# Patient Record
Sex: Male | Born: 1960 | Race: White | Hispanic: No | Smoking: Current some day smoker
Health system: Southern US, Community
[De-identification: ages and names within clinical notes are randomized; demographics above are authoritative.]

## PROBLEM LIST (undated history)

## (undated) DIAGNOSIS — T7840XA Allergy, unspecified, initial encounter: Secondary | ICD-10-CM

## (undated) DIAGNOSIS — M1990 Unspecified osteoarthritis, unspecified site: Secondary | ICD-10-CM

## (undated) DIAGNOSIS — J301 Allergic rhinitis due to pollen: Secondary | ICD-10-CM

## (undated) DIAGNOSIS — I10 Essential (primary) hypertension: Secondary | ICD-10-CM

## (undated) HISTORY — DX: Unspecified osteoarthritis, unspecified site: M19.90

## (undated) HISTORY — DX: Allergy, unspecified, initial encounter: T78.40XA

## (undated) HISTORY — DX: Allergic rhinitis due to pollen: J30.1

## (undated) HISTORY — PX: TONSILLECTOMY AND ADENOIDECTOMY: SUR1326

---

## 2003-09-14 HISTORY — PX: OTHER SURGICAL HISTORY: SHX169

## 2005-04-27 ENCOUNTER — Ambulatory Visit: Payer: Self-pay | Admitting: Otolaryngology

## 2008-07-30 ENCOUNTER — Ambulatory Visit: Payer: Self-pay | Admitting: Family Medicine

## 2008-07-30 DIAGNOSIS — B356 Tinea cruris: Secondary | ICD-10-CM

## 2008-07-30 DIAGNOSIS — J309 Allergic rhinitis, unspecified: Secondary | ICD-10-CM | POA: Insufficient documentation

## 2008-07-30 DIAGNOSIS — R599 Enlarged lymph nodes, unspecified: Secondary | ICD-10-CM | POA: Insufficient documentation

## 2008-07-31 LAB — CONVERTED CEMR LAB
Basophils Absolute: 0 10*3/uL (ref 0.0–0.1)
Eosinophils Absolute: 0.1 10*3/uL (ref 0.0–0.7)
Eosinophils Relative: 1.8 % (ref 0.0–5.0)
MCHC: 34.3 g/dL (ref 30.0–36.0)
MCV: 96.2 fL (ref 78.0–100.0)
Neutrophils Relative %: 60.1 % (ref 43.0–77.0)
PSA: 0.45 ng/mL (ref 0.10–4.00)
Platelets: 177 10*3/uL (ref 150–400)
RDW: 12.9 % (ref 11.5–14.6)
Sed Rate: 6 mm/hr (ref 0–16)
WBC: 4 10*3/uL — ABNORMAL LOW (ref 4.5–10.5)

## 2008-09-24 ENCOUNTER — Ambulatory Visit: Payer: Self-pay | Admitting: Family Medicine

## 2008-09-24 DIAGNOSIS — R079 Chest pain, unspecified: Secondary | ICD-10-CM

## 2008-09-24 DIAGNOSIS — M25519 Pain in unspecified shoulder: Secondary | ICD-10-CM | POA: Insufficient documentation

## 2008-10-08 LAB — CONVERTED CEMR LAB
AST: 27 units/L (ref 0–37)
Albumin: 4.5 g/dL (ref 3.5–5.2)
Alkaline Phosphatase: 33 units/L — ABNORMAL LOW (ref 39–117)
BUN: 16 mg/dL (ref 6–23)
Bilirubin, Direct: 0.1 mg/dL (ref 0.0–0.3)
CO2: 31 meq/L (ref 19–32)
Chloride: 105 meq/L (ref 96–112)
Cholesterol: 177 mg/dL (ref 0–200)
Glucose, Bld: 107 mg/dL — ABNORMAL HIGH (ref 70–99)
Potassium: 4.6 meq/L (ref 3.5–5.1)
Sodium: 141 meq/L (ref 135–145)
Total Protein: 6.8 g/dL (ref 6.0–8.3)

## 2010-01-08 ENCOUNTER — Telehealth (INDEPENDENT_AMBULATORY_CARE_PROVIDER_SITE_OTHER): Payer: Self-pay | Admitting: *Deleted

## 2010-10-13 NOTE — Progress Notes (Signed)
Summary: clarinex   Phone Note Refill Request Call back at Home Phone [phone] Message from:  Patient on January 08, 2010 10:24 AM  Refills Requested: Medication #1:  CLARINEX-D 12 HOUR 2.5-120 MG XR12H-TAB take one tablet as needed Patient is having problems with his allergies and would like a refill sent to [facility] Please advise.   Initial call taken by: Melody Comas,  January 08, 2010 10:25 AM    Prescriptions: CLARINEX-D 12 HOUR 2.5-120 MG XR12H-TAB [facility]) take one tablet as needed  #180 x 0   Entered by:   Benny Lennert CMA (AAMA)   Authorized by:   Kerby Nora MD   Signed by:   Benny Lennert CMA (AAMA) on 01/08/2010   Method used:   Electronically to        [id number] (retail)       [address]       [place], [place]  [address]       Ph: [phone]       Fax: [phone]   RxID:   [id number]

## 2011-01-19 ENCOUNTER — Other Ambulatory Visit: Payer: Self-pay | Admitting: Family Medicine

## 2011-01-25 ENCOUNTER — Telehealth: Payer: Self-pay | Admitting: *Deleted

## 2011-01-25 NOTE — Telephone Encounter (Signed)
Fax from pharmacy states clarinex D 12 hour is on back order, they are asking to change to something else.

## 2011-01-25 NOTE — Telephone Encounter (Signed)
Rec OTC Claritin-D or Zyrtec-D. These are almost the same.

## 2011-01-26 NOTE — Telephone Encounter (Signed)
Fax faxed back to pharmacy with info.

## 2011-02-16 ENCOUNTER — Encounter: Payer: Self-pay | Admitting: Family Medicine

## 2011-02-17 ENCOUNTER — Encounter: Payer: Self-pay | Admitting: Family Medicine

## 2011-02-17 ENCOUNTER — Ambulatory Visit (INDEPENDENT_AMBULATORY_CARE_PROVIDER_SITE_OTHER): Payer: BC Managed Care – PPO | Admitting: Family Medicine

## 2011-02-17 DIAGNOSIS — F32A Depression, unspecified: Secondary | ICD-10-CM | POA: Insufficient documentation

## 2011-02-17 DIAGNOSIS — R079 Chest pain, unspecified: Secondary | ICD-10-CM

## 2011-02-17 DIAGNOSIS — F3289 Other specified depressive episodes: Secondary | ICD-10-CM

## 2011-02-17 DIAGNOSIS — F329 Major depressive disorder, single episode, unspecified: Secondary | ICD-10-CM

## 2011-02-17 HISTORY — DX: Depression, unspecified: F32.A

## 2011-02-17 MED ORDER — CITALOPRAM HYDROBROMIDE 20 MG PO TABS: 20.0000 mg | ORAL_TABLET | Freq: Every day | ORAL | Status: DC

## 2011-02-17 MED ORDER — DESLORATADINE-PSEUDOEPHED ER 2.5-120 MG PO TB12: 1.0000 | ORAL_TABLET | Freq: Every day | ORAL | Status: DC

## 2011-02-17 MED ORDER — SILDENAFIL CITRATE 100 MG PO TABS: 100.0000 mg | ORAL_TABLET | ORAL | Status: AC | PRN

## 2011-02-17 NOTE — Progress Notes (Signed)
Chad Petersen, a 49 y.o. male presents today in the office for the following:    Chest pain: No smoker No FH CAD Tob? No No HTN Handful of times. Will wonder why it will happen. Will take a baby aspirin. Will last maybe a couple of hours, sometimes much less than this. Goes on the stair stepper for 3-5 minutes.  Had some mild chest pain. Not associated with exertion.  Depression:  Wife had lumpectomy, radiation, chemo actively. He is taking this very poorly. He is upset, and his wife has become very frail. Their relationship is significantly changed compared to when they were younger. 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   Depression Viagra trial  Patient Active Problem List  Diagnoses  . ALLERGIC RHINITIS  . Depression   Past Medical History  Diagnosis Date  . Arthritis   . Allergic rhinitis due to pollen    Past Surgical History  Procedure Date  . Facial lump 2005    removed   History  Substance Use Topics  . Smoking status: Never Smoker   . Smokeless tobacco: Not on file  . Alcohol Use: Yes   Family History  Problem Relation Age of Onset  . Diabetes Other   . Cancer Other     prostate   No Known Allergies Current Outpatient Prescriptions on File Prior to Visit  Medication Sig Dispense Refill  . DISCONTD: CLARINEX-D 12 HOUR 2.5-120 MG per tablet TAKE ONE TABLET AS NEEDED  180 tablet  3  . DISCONTD: LORazepam (ATIVAN) 0.5 MG tablet Take 0.5 mg by mouth 3 (three) times daily as needed.         ROS: GEN: No acute illnesses, no fevers, chills. GI: No n/v/d, eating normally Pulm: No SOB Interactive and getting along well at home.  Otherwise, ROS is as per the HPI.   Physical Exam  Blood pressure 110/80, pulse 64, temperature 97.9 F (36.6 C), temperature source Oral, height 5\' 10"  (1.778 m), weight 161 lb 12 oz (73.369 kg).  GEN: WDWN, NAD, Non-toxic, A & O x 3 HEENT: Atraumatic, Normocephalic. Neck supple. No  masses, No LAD. Ears and Nose: No external deformity. CV: RRR, No M/G/R. No JVD. No thrill. No extra heart sounds. PULM: CTA B, no wheezes, crackles, rhonchi. No retractions. No resp. distress. No accessory muscle use. EXTR: No c/c/e NEURO Normal gait.  PSYCH: Normally interactive. Conversant. Not depressed or anxious appearing.  Calm demeanor.   A/P:  Impression: I think the patient meets clinical criteria for major depression. Some of this may be situational. Him and start him on an SSRI and followup in month.  2. Chest pain: At this point, think is history sounds very atypical, and most consistent with depression and anxiety associated chest pain. He has zero risk factors and a very atypical story. I think it is reasonable to start his antidepressant and then followup in a month.  3. Erectile difficulties, trial of Viagra.

## 2011-02-17 NOTE — Patient Instructions (Signed)
AVOID SUDAFED OR SOMETHING WITH -D  ALLEGRA ZYRTEC CLARITIN

## 2011-03-29 ENCOUNTER — Ambulatory Visit: Payer: BC Managed Care – PPO | Admitting: Family Medicine

## 2011-10-08 ENCOUNTER — Ambulatory Visit (INDEPENDENT_AMBULATORY_CARE_PROVIDER_SITE_OTHER): Payer: BC Managed Care – PPO | Admitting: Family Medicine

## 2011-10-08 ENCOUNTER — Encounter: Payer: Self-pay | Admitting: Family Medicine

## 2011-10-08 VITALS — BP 130/80 | HR 88 | Temp 98.1°F | Ht 70.0 in | Wt 157.4 lb

## 2011-10-08 DIAGNOSIS — Z202 Contact with and (suspected) exposure to infections with a predominantly sexual mode of transmission: Secondary | ICD-10-CM | POA: Insufficient documentation

## 2011-10-08 DIAGNOSIS — N509 Disorder of male genital organs, unspecified: Secondary | ICD-10-CM

## 2011-10-08 DIAGNOSIS — N50811 Right testicular pain: Secondary | ICD-10-CM

## 2011-10-08 DIAGNOSIS — R109 Unspecified abdominal pain: Secondary | ICD-10-CM

## 2011-10-08 DIAGNOSIS — R1031 Right lower quadrant pain: Secondary | ICD-10-CM

## 2011-10-08 LAB — POCT URINALYSIS DIPSTICK
Bilirubin, UA: NEGATIVE
Blood, UA: NEGATIVE
Nitrite, UA: NEGATIVE
Spec Grav, UA: 1.02
Urobilinogen, UA: NEGATIVE
pH, UA: 6.5

## 2011-10-08 MED ORDER — CIPROFLOXACIN HCL 500 MG PO TABS: 500.0000 mg | ORAL_TABLET | Freq: Two times a day (BID) | ORAL | Status: AC

## 2011-10-08 NOTE — Patient Instructions (Signed)
Elevating testicle can help with pain.  Can use tylenol for pain. Start and complete antibiotics. We will call with culture results.

## 2011-10-08 NOTE — Assessment & Plan Note (Signed)
UA clear.   No clear suggestion of mass, torsion or hernia.  Concern for epididimitis given area of tenderness. Given recent STD exposure...send GC/Chlamydia probe and treat with antibiotics to cover GC/chlam and other bacteria. Call if symtpoms not improving as expected during course of treatment.

## 2011-10-08 NOTE — Progress Notes (Signed)
  Subjective:    Patient ID: Chad Petersen, male    DOB: [birth year], 51 y.o.   MRN: [id number]  HPI  51 year old male patient of Dr. Cyndie Chime presents with 2 day history of right testicular pain and pain in right lower groin. 2-4/10 on pain scale. No burning with urination, small amount of discharge/leakage this morning. No blood in urine. No fever, no abdominal pain. No flank pain. No penile skin lesions.  He has been widowed in last few months, last weekend went to a bar and had a "one night stand."  Used a condom, but it broke.   No history of UTI or other infection.    Review of Systems  Constitutional: Negative for fever and fatigue.  HENT: Negative for ear pain.   Eyes: Negative for pain.  Respiratory: Negative for cough.   Cardiovascular: Negative for chest pain.  Genitourinary: Positive for testicular pain. Negative for dysuria, urgency, frequency, hematuria, flank pain, scrotal swelling, genital sores and penile pain.       Objective:   Physical Exam  Constitutional: He appears well-developed and well-nourished.  Abdominal: Soft. Bowel sounds are normal. He exhibits no distension and no mass. There is no tenderness. There is no rebound and no guarding.  Genitourinary: Penis normal. Right testis shows tenderness. Right testis shows no mass. Left testis shows no mass and no tenderness. Circumcised. No phimosis, paraphimosis, hypospadias, penile erythema or penile tenderness. No discharge found.       Right groin pain mild          Assessment & Plan:

## 2011-10-08 NOTE — Progress Notes (Signed)
Addended by: Kerby Nora E on: 10/08/2011 08:55 AM   Modules accepted: Orders

## 2011-10-09 LAB — HIV ANTIBODY (ROUTINE TESTING W REFLEX): HIV: NONREACTIVE

## 2011-10-09 LAB — RPR

## 2011-10-11 LAB — HSV(HERPES SMPLX)ABS-I+II(IGG+IGM)-BLD: HSV 1 Glycoprotein G Ab, IgG: 0.19 IV

## 2011-11-16 ENCOUNTER — Ambulatory Visit (INDEPENDENT_AMBULATORY_CARE_PROVIDER_SITE_OTHER): Payer: BC Managed Care – PPO | Admitting: Family Medicine

## 2011-11-16 ENCOUNTER — Encounter: Payer: Self-pay | Admitting: Family Medicine

## 2011-11-16 VITALS — BP 140/98 | HR 100 | Temp 99.0°F | Ht 69.0 in | Wt 160.0 lb

## 2011-11-16 DIAGNOSIS — R05 Cough: Secondary | ICD-10-CM

## 2011-11-16 MED ORDER — GUAIFENESIN-CODEINE 100-10 MG/5ML PO SYRP: 5.0000 mL | ORAL_SOLUTION | Freq: Every evening | ORAL | Status: DC | PRN

## 2011-11-16 NOTE — Patient Instructions (Addendum)
Goal blood pressure is less than 140/90.  Keep an eye on this, if staying consistently high, return to see [place]. Sounds like you have a viral upper respiratory infection. Antibiotics are not needed for this.  Viral infections usually take 7-10 days to resolve.  The cough can last several weeks to go away. Use medication as prescribed: cheratussin for cough at night. Push fluids and plenty of rest. Please return if you are not improving as expected, or if you have high fevers (>101.5) or difficulty swallowing or worsening productive cough. Call clinic with questions.  Good to see you today.

## 2011-11-16 NOTE — Assessment & Plan Note (Signed)
Anticipate post - viral cough. See pt instructions. Update [place] if not improving as expected.

## 2011-11-16 NOTE — Progress Notes (Signed)
  Subjective:    Patient ID: Chad Petersen, male    DOB: 1960/09/21, 51 y.o.   MRN: [id number]  HPI CC: cough, congestion  1 1/2 wk h/o coughing.  Worsening over weekend.  Sat - Monday worse with trouble sleeping at night 2/2 cough.  Some improvement today.  Sinuses started hurting today (burning). Mild chill.  + ST initially and PNDrainage.  Mild nasal congestion.  Cough is dry.    No fevers, ear pain or tooth pain, abd pain, n/v.  No RN.  No h/o GERD.    bp elevated today, may be taking med with decongestant.  No sick contacts at home.  No smokers at home.  No h/o asthma.  + seasonal allergies but currently not an issue.  Review of Systems Per HPI    Objective:   Physical Exam  Nursing note and vitals reviewed. Constitutional: He appears well-developed and well-nourished. No distress.  HENT:  Head: Normocephalic and atraumatic.  Right Ear: Hearing, tympanic membrane, external ear and ear canal normal.  Left Ear: Hearing, tympanic membrane, external ear and ear canal normal.  Nose: Nose normal. No mucosal edema or rhinorrhea. Right sinus exhibits no maxillary sinus tenderness and no frontal sinus tenderness. Left sinus exhibits no maxillary sinus tenderness and no frontal sinus tenderness.  Mouth/Throat: Uvula is midline and mucous membranes are normal. Posterior oropharyngeal erythema present. No oropharyngeal exudate, posterior oropharyngeal edema or tonsillar abscesses.       + PN white drainage   Eyes: Conjunctivae and EOM are normal. Pupils are equal, round, and reactive to light. No scleral icterus.  Neck: Normal range of motion. Neck supple.  Cardiovascular: Normal rate, regular rhythm, normal heart sounds and intact distal pulses.   No murmur heard. Pulmonary/Chest: Effort normal and breath sounds normal. No respiratory distress. He has no wheezes. He has no rales.  Lymphadenopathy:    He has no cervical adenopathy.  Skin: Skin is warm and dry. No rash noted.         Assessment & Plan:

## 2011-11-17 ENCOUNTER — Telehealth: Payer: Self-pay | Admitting: Family Medicine

## 2011-11-17 MED ORDER — AZITHROMYCIN 250 MG PO TABS: ORAL_TABLET | ORAL | Status: AC

## 2011-11-17 MED ORDER — HYDROCOD POLST-CHLORPHEN POLST 10-8 MG/5ML PO LQCR: 5.0000 mL | Freq: Every evening | ORAL | Status: DC | PRN

## 2011-11-17 NOTE — Telephone Encounter (Signed)
When seen was improving.  If deteriorated, ok to send in zpack (sent in) and please call in tussionex as well.  Update [place] if not improving as expected.

## 2011-11-17 NOTE — Telephone Encounter (Signed)
Could do tussionex? i will defer abx to dr. g who saw him yest

## 2011-11-17 NOTE — Telephone Encounter (Signed)
Pt is calling concerning his cough. The cough med prescribed is not helping and the pt has said that his cough has become progressively worse since yesterday. He is wanting to know if an antibiotic could be sent in to his pharmacy.   Note forwarded to Dr. Patsy Lager as well (Primary Physician)

## 2011-11-18 NOTE — Telephone Encounter (Signed)
Rx called in as directed.   

## 2011-11-26 ENCOUNTER — Telehealth: Payer: Self-pay | Admitting: *Deleted

## 2011-11-26 ENCOUNTER — Other Ambulatory Visit: Payer: Self-pay | Admitting: Family Medicine

## 2011-11-26 NOTE — Telephone Encounter (Signed)
Received refill request for Z-pack. Spoke with patient and he said he is still coughing mostly at night and was wanting the refill. I advised that the zpack stays in his system for about 10 after completion and that the cough can linger. I also advised to continue the tussionex and if not better by mid-week next week, then to call me back and let me know. He denies fever or any other symptoms-only the cough.

## 2012-10-02 ENCOUNTER — Ambulatory Visit (INDEPENDENT_AMBULATORY_CARE_PROVIDER_SITE_OTHER): Payer: BC Managed Care – PPO | Admitting: Family Medicine

## 2012-10-02 ENCOUNTER — Telehealth: Payer: Self-pay | Admitting: Family Medicine

## 2012-10-02 ENCOUNTER — Encounter: Payer: Self-pay | Admitting: Family Medicine

## 2012-10-02 VITALS — BP 140/80 | HR 89 | Temp 100.3°F | Ht 69.0 in | Wt 162.0 lb

## 2012-10-02 DIAGNOSIS — R6889 Other general symptoms and signs: Secondary | ICD-10-CM

## 2012-10-02 DIAGNOSIS — J111 Influenza due to unidentified influenza virus with other respiratory manifestations: Secondary | ICD-10-CM

## 2012-10-02 NOTE — Progress Notes (Signed)
    [facility] [address] [place] [address] Phone: [phone] Fax: [phone]  Date:  10/02/2012   Name:  Chad Petersen   DOB:  [birth year]   MRN:  [id number] Gender: male Age: 52 y.o.  PCP:  Hannah Beat, MD  Evaluating MD: Hannah Beat, MD   Chief Complaint: Sore Throat, Cough, Fatigue and Fever   History of Present Illness:  Chad Petersen is a 52 y.o. pleasant patient who presents with the following:  Cough, ST some, feels really weak, neck is stiff some. Skin is kind of sensitive. Other than neck not that much aches. Coughing a lot. Little bit of a runny nose.  Sat - had flu / Friday exposure.  Coughing a lot and gen not feeling well Tmax 100.3  Patient Active Problem List  Diagnosis  . ALLERGIC RHINITIS  . Depression  . Right testicular pain  . Exposure to STD    Past Medical History  Diagnosis Date  . Arthritis   . Allergic rhinitis due to pollen     Past Surgical History  Procedure Date  . Facial lump 2005    removed    History  Substance Use Topics  . Smoking status: Never Smoker   . Smokeless tobacco: Not on file  . Alcohol Use: Yes     Comment: Regular    Family History  Problem Relation Age of Onset  . Diabetes Other   . Cancer Other     prostate    No Known Allergies  Medication list has been reviewed and updated.  Outpatient Prescriptions Prior to Visit  Medication Sig Dispense Refill  . [DISCONTINUED] chlorpheniramine-HYDROcodone (TUSSIONEX) 10-8 MG/5ML LQCR Take 5 mLs by mouth at bedtime as needed. Sedation precautions  140 mL  0   Last reviewed on 10/02/2012 11:40 AM by Consuello Masse, CMA  Review of Systems:  ROS: GEN: Acute illness details above GI: Tolerating PO intake GU: maintaining adequate hydration and urination Pulm: No SOB Interactive and getting along well at home.  Otherwise, ROS is as per the HPI.   Physical Examination: BP 140/80  Pulse 89  Temp 100.3 F  (37.9 C) (Oral)  Ht 5\' 9"  (1.753 m)  Wt 162 lb (73.483 kg)  BMI 23.92 kg/m2  SpO2 96%  Ideal Body Weight: Weight in (lb) to have BMI = 25: 168.9    Gen: WDWN, NAD; A & O x3, cooperative. Pleasant.Globally Non-toxic HEENT: Normocephalic and atraumatic. Throat clear, w/o exudate, R TM clear, L TM - good landmarks, No fluid present. rhinnorhea. No frontal or maxillary sinus T. MMM NECK: Anterior cervical  LAD is present CV: RRR, No M/G/R, cap refill <2 sec PULM: Breathing comfortably in no respiratory distress. no wheezing, crackles, rhonchi ABD: S,NT,ND,+BS. No HSM. No rebound. EXT: No c/c/e PSYCH: Friendly, good eye contact MSK: Nml gait  Assessment and Plan:  1. Flu-like symptoms    Flu test neg  Supportive care dayquil and nyquil  Orders Today:  No orders of the defined types were placed in this encounter.    Updated Medication List: (Includes new medications, updates to list, dose adjustments) No orders of the defined types were placed in this encounter.    Medications Discontinued: Medications Discontinued During This Encounter  Medication Reason  . chlorpheniramine-HYDROcodone (TUSSIONEX) 10-8 MG/5ML LQCR      Hannah Beat, MD

## 2012-10-02 NOTE — Addendum Note (Signed)
Addended by: Consuello Masse on: 10/02/2012 12:23 PM   Modules accepted: Orders

## 2012-10-02 NOTE — Telephone Encounter (Signed)
Patient Information:  Caller Name: [place]  Phone: [phone]  Patient: Chad Petersen, Chad Petersen  Gender: Male  DOB: July 11, 1961  Age: 52 Years  PCP: Hannah Beat (Family Practice)  Office Follow Up:  Does the office need to follow up with this patient?: No  Instructions For The Office: N/A   Symptoms  Reason For Call & Symptoms: cough, sore throat and fever  Reviewed Health History In EMR: Yes  Reviewed Medications In EMR: Yes  Reviewed Allergies In EMR: Yes  Reviewed Surgeries / Procedures: Yes  Date of Onset of Symptoms: 10/01/2012  Treatments Tried: OTC Tylenol Cold and flu  Treatments Tried Worked: No  Any Fever: Yes  Fever Taken: Oral  Fever Time Of Reading: 00:00:00  Fever Last Reading: 99  Guideline(s) Used:  Sore Throat  Disposition Per Guideline:   See Today in Office  Reason For Disposition Reached:   Patient wants to be seen  Advice Given:  For Relief of Sore Throat Pain:  Sip warm chicken broth or apple juice.  Suck on hard candy or a throat lozenge (over-the-counter).  Gargle warm salt water 3 times daily (1 teaspoon of salt in 8 oz or 240 ml of warm water).  Pain Medicines:  For pain relief, you can take either acetaminophen, ibuprofen, or naproxen.  Soft Diet:   Cold drinks and milk shakes are especially good (Reason: swollen tonsils can make some foods hard to swallow).  Liquids:  Adequate liquid intake is important to prevent dehydration. Drink 6-8 glasses of water per day.  Call Back If:  You become worse.  Appointment Scheduled:  10/02/2012 11:30:00 Appointment Scheduled Provider:  Hannah Beat [facility])

## 2012-10-15 ENCOUNTER — Emergency Department: Payer: Self-pay | Admitting: Unknown Physician Specialty

## 2014-08-31 ENCOUNTER — Emergency Department: Payer: Self-pay | Admitting: Emergency Medicine

## 2014-08-31 LAB — URINALYSIS, COMPLETE
BILIRUBIN, UR: NEGATIVE
Blood: NEGATIVE
GLUCOSE, UR: NEGATIVE mg/dL (ref 0–75)
KETONE: NEGATIVE
NITRITE: NEGATIVE
Ph: 5 (ref 4.5–8.0)
Protein: 30
SPECIFIC GRAVITY: 1.026 (ref 1.003–1.030)
SQUAMOUS EPITHELIAL: NONE SEEN
WBC UR: 193 /HPF (ref 0–5)

## 2014-08-31 LAB — GC/CHLAMYDIA PROBE AMP

## 2014-09-05 ENCOUNTER — Emergency Department: Payer: Self-pay | Admitting: Emergency Medicine

## 2015-10-13 ENCOUNTER — Ambulatory Visit (INDEPENDENT_AMBULATORY_CARE_PROVIDER_SITE_OTHER): Payer: BLUE CROSS/BLUE SHIELD | Admitting: Family Medicine

## 2015-10-13 ENCOUNTER — Encounter: Payer: Self-pay | Admitting: Family Medicine

## 2015-10-13 VITALS — BP 124/88 | HR 54 | Temp 97.8°F | Ht 69.0 in | Wt 170.5 lb

## 2015-10-13 DIAGNOSIS — K922 Gastrointestinal hemorrhage, unspecified: Secondary | ICD-10-CM

## 2015-10-13 DIAGNOSIS — K921 Melena: Secondary | ICD-10-CM | POA: Diagnosis not present

## 2015-10-13 DIAGNOSIS — Z125 Encounter for screening for malignant neoplasm of prostate: Secondary | ICD-10-CM | POA: Diagnosis not present

## 2015-10-13 DIAGNOSIS — Z13220 Encounter for screening for lipoid disorders: Secondary | ICD-10-CM

## 2015-10-13 DIAGNOSIS — R5383 Other fatigue: Secondary | ICD-10-CM

## 2015-10-13 NOTE — Patient Instructions (Signed)

## 2015-10-13 NOTE — Progress Notes (Signed)
Dr. Karleen Hampshire T. Geneve Kimpel, MD, CAQ Sports [facility] [address] [place], [address] Phone: [phone] Fax: [phone]  10/13/2015  Patient: Chad Petersen, MRN: [id number], DOB: [birth year], 55 y.o.  Primary Physician:  Hannah Beat, MD   Chief Complaint  Patient presents with  . Blood In Stools   Subjective:   Chad Petersen is a 55 y.o. very pleasant male patient who presents with the following:  Bloody stools:   BRBPR - for a couple of days. Water a little bit pink and was a lot this morning in the toilet.  He is 52 and has never had a colonoscopy.  No pain in behind.  Last week had some abd pain - none now.   Does not feel weak or feint.  Past Medical History, Surgical History, Social History, Family History, Problem List, Medications, and Allergies have been reviewed and updated if relevant.  Patient Active Problem List   Diagnosis Date Noted  . Right testicular pain 10/08/2011  . Exposure to STD 10/08/2011  . Depression 02/17/2011  . ALLERGIC RHINITIS 07/30/2008    Past Medical History  Diagnosis Date  . Arthritis   . Allergic rhinitis due to pollen     Past Surgical History  Procedure Laterality Date  . Facial lump  2005    removed    Social History   Social History  . Marital Status: Single    Spouse Name: N/A  . Number of Children: N/A  . Years of Education: N/A   Occupational History  . Not on file.   Social History Main Topics  . Smoking status: Never Smoker   . Smokeless tobacco: Never Used  . Alcohol Use: 0.0 oz/week    0 Standard drinks or equivalent per week     Comment: Regular  . Drug Use: No  . Sexual Activity: Not on file   Other Topics Concern  . Not on file   Social History Narrative   Married, wife has breast cancer   About 2 Liquor drinks a night    Family History  Problem Relation Age of Onset  . Diabetes Other   . Cancer Other     prostate    No Known  Allergies  Medication list reviewed and updated in full in [facility].   GEN: No acute illnesses, no fevers, chills. GI: No n/v/d, eating normally Pulm: No SOB Interactive and getting along well at home.  Otherwise, ROS is as per the HPI.  Objective:   BP 124/88 mmHg  Pulse 54  Temp(Src) 97.8 F (36.6 C) (Oral)  Ht  (1.753 m)  Wt 170 lb 8 oz (77.338 kg)  BMI 25.17 kg/m2  GEN: WDWN, NAD, Non-toxic, A & O x 3 HEENT: Atraumatic, Normocephalic. Neck supple. No masses, No LAD. Ears and Nose: No external deformity. CV: RRR, No M/G/R. No JVD. No thrill. No extra heart sounds. PULM: CTA B, no wheezes, crackles, rhonchi. No retractions. No resp. distress. No accessory muscle use. ABD: S, NT, ND, + BS, No rebound, No HSM  Rectal: external hemorrhoids, good sphincter tone. None appear to be actively bleeding. Question one small internal hemorrhoid. Mild prostate enlargement.  EXTR: No c/c/e NEURO Normal gait.  PSYCH: Normally interactive. Conversant. Not depressed or anxious appearing.  Calm demeanor.   Laboratory and Imaging Data:  Assessment and Plan:   Gastrointestinal hemorrhage, unspecified gastritis, unspecified gastrointestinal hemorrhage type - Plan: CBC with Differential/Platelet, Ambulatory referral to  Gastroenterology  Bloody stools - Plan: CBC with Differential/Platelet, Ambulatory referral to Gastroenterology  Screening, lipid - Plan: Lipid panel  Screening PSA (prostate specific antigen) - Plan: PSA  Other fatigue - Plan: Basic metabolic panel, Hepatic function panel  Gi bleed - acute. Appears stable for outpatient care with close gi f/u. Bleeding with no prior colonoscopy - does not appear to be from hemorrhoid.   Check CbC and other basic labs that are overdue  Follow-up: No Follow-up on file.  Orders Placed This Encounter  Procedures  . Basic metabolic panel  . CBC with Differential/Platelet  . Hepatic function panel  . PSA  . Lipid panel   . Ambulatory referral to Gastroenterology    Signed,  Karleen Hampshire T. Kadin Bera, MD   Patient's Medications   No medications on file

## 2015-10-13 NOTE — Progress Notes (Signed)
Pre visit review using our clinic review tool, if applicable. No additional management support is needed unless otherwise documented below in the visit note. 

## 2015-10-14 LAB — HEPATIC FUNCTION PANEL
ALT: 21 U/L (ref 0–53)
AST: 21 U/L (ref 0–37)
Albumin: 4.5 g/dL (ref 3.5–5.2)
Alkaline Phosphatase: 34 U/L — ABNORMAL LOW (ref 39–117)
BILIRUBIN DIRECT: 0.1 mg/dL (ref 0.0–0.3)
BILIRUBIN TOTAL: 0.5 mg/dL (ref 0.2–1.2)
Total Protein: 6.8 g/dL (ref 6.0–8.3)

## 2015-10-14 LAB — CBC WITH DIFFERENTIAL/PLATELET
BASOS PCT: 0.5 % (ref 0.0–3.0)
Basophils Absolute: 0 10*3/uL (ref 0.0–0.1)
EOS ABS: 0.1 10*3/uL (ref 0.0–0.7)
EOS PCT: 2.1 % (ref 0.0–5.0)
HCT: 44 % (ref 39.0–52.0)
HEMOGLOBIN: 14.4 g/dL (ref 13.0–17.0)
LYMPHS ABS: 1.5 10*3/uL (ref 0.7–4.0)
Lymphocytes Relative: 27 % (ref 12.0–46.0)
MCHC: 32.8 g/dL (ref 30.0–36.0)
MCV: 97.1 fl (ref 78.0–100.0)
MONO ABS: 0.4 10*3/uL (ref 0.1–1.0)
Monocytes Relative: 6.3 % (ref 3.0–12.0)
NEUTROS PCT: 64.1 % (ref 43.0–77.0)
Neutro Abs: 3.7 10*3/uL (ref 1.4–7.7)
Platelets: 229 10*3/uL (ref 150.0–400.0)
RBC: 4.53 Mil/uL (ref 4.22–5.81)
RDW: 13.7 % (ref 11.5–15.5)
WBC: 5.7 10*3/uL (ref 4.0–10.5)

## 2015-10-14 LAB — BASIC METABOLIC PANEL
BUN: 18 mg/dL (ref 6–23)
CHLORIDE: 104 meq/L (ref 96–112)
CO2: 30 meq/L (ref 19–32)
Calcium: 9.6 mg/dL (ref 8.4–10.5)
Creatinine, Ser: 0.95 mg/dL (ref 0.40–1.50)
GFR: 87.77 mL/min (ref >60.00)
GLUCOSE: 114 mg/dL — AB (ref 70–99)
POTASSIUM: 4.2 meq/L (ref 3.5–5.1)
SODIUM: 140 meq/L (ref 135–145)

## 2015-10-14 LAB — LIPID PANEL
CHOLESTEROL: 224 mg/dL — AB (ref 0–200)
HDL: 64.3 mg/dL (ref >39.00)
LDL CALC: 139 mg/dL — AB (ref 0–99)
NonHDL: 159.43
Total CHOL/HDL Ratio: 3
Triglycerides: 102 mg/dL (ref 0.0–149.0)
VLDL: 20.4 mg/dL (ref 0.0–40.0)

## 2015-10-14 LAB — PSA: PSA: 0.42 ng/mL (ref 0.10–4.00)

## 2015-10-15 ENCOUNTER — Ambulatory Visit: Payer: BLUE CROSS/BLUE SHIELD | Admitting: Physician Assistant

## 2015-10-15 ENCOUNTER — Encounter: Payer: Self-pay | Admitting: Physician Assistant

## 2015-10-15 ENCOUNTER — Encounter: Payer: Self-pay | Admitting: *Deleted

## 2015-10-15 ENCOUNTER — Ambulatory Visit (INDEPENDENT_AMBULATORY_CARE_PROVIDER_SITE_OTHER): Payer: BLUE CROSS/BLUE SHIELD | Admitting: Physician Assistant

## 2015-10-15 VITALS — BP 124/82 | HR 72 | Ht 69.0 in | Wt 172.1 lb

## 2015-10-15 DIAGNOSIS — K625 Hemorrhage of anus and rectum: Secondary | ICD-10-CM

## 2015-10-15 MED ORDER — NA SULFATE-K SULFATE-MG SULF 17.5-3.13-1.6 GM/177ML PO SOLN: ORAL | Status: DC

## 2015-10-15 NOTE — Progress Notes (Signed)
Agree with assessment and plan as outlined.  

## 2015-10-15 NOTE — Patient Instructions (Signed)
You have been scheduled for a colonoscopy. Please follow written instructions given to you at your visit today.  Please pick up your prep supplies at the pharmacy within the next 1-3 days.  [facility] [address], [place], [place].  If you use inhalers (even only as needed), please bring them with you on the day of your procedure. Your physician has requested that you go to www.startemmi.com and enter the access code given to you at your visit today. This web site gives a general overview about your procedure. However, you should still follow specific instructions given to you by our office regarding your preparation for the procedure.

## 2015-10-15 NOTE — Progress Notes (Signed)
Patient ID: Chad Petersen, male   DOB: 1961-08-26, 55 y.o.   MRN: [id number]   Subjective:    Patient ID: Chad Petersen, male    DOB: 1961/01/13, 55 y.o.   MRN: [id number]  HPI  [name]  Is a pleasant 55 year old white male, new to GI today referred by Dr. Kerin Perna. He comes in with complaints of recent rectal bleeding. He has not had any prior GI evaluation. Patient says his current symptoms started last week when he noted bright red blood in the commode after a bowel movement. He then had an another episode 3 days later in this time saw a lot more bright red blood and blood "draping" into the commode area did stool appeared to be brown. Yesterday he had another episode with just a small amount of blood turning the toilet water pink. He has not had any rectal discomfort. No changes in his bowel movements which have been normal. He says a week or so ago he did 7 some mild lower abdominal discomfort but that resolved. His appetite has been  and his weight has been stable. Is not on any regular aspirin or NSAIDs. Family history negative for colon cancer or polyps  Labs done on 1 32,017 -WBC 5.7, hemoglobin 14.4, hematocrit 44.0  Review of Systems Pertinent positive and negative review of systems were noted in the above HPI section.  All other review of systems was otherwise negative.  Outpatient Encounter Prescriptions as of 10/15/2015  Medication Sig  . Na Sulfate-K Sulfate-Mg Sulf SOLN Take as directed for colonoscopy prep.   No facility-administered encounter medications on file as of 10/15/2015.   No Known Allergies Patient Active Problem List   Diagnosis Date Noted  . Depression 02/17/2011  . ALLERGIC RHINITIS 07/30/2008   Social History   Social History  . Marital Status: Single    Spouse Name: N/A  . Number of Children: N/A  . Years of Education: N/A   Occupational History  . 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 Smoking status: Never Smoker   . Smokeless  tobacco: Never Used  . Alcohol Use: 0.0 oz/week    0 Standard drinks or equivalent per week     Comment: Regular  . Drug Use: No  . Sexual Activity: Not on file   Other Topics Concern  . Not on file   Social History Narrative   Married, wife had breast cancer         About 2 Liquor drinks a night    Mr. Bazinet family history includes Bleeding Disorder in his father; Diabetes in his father; Pancreatic cancer in his father.      Objective:    Filed Vitals:   10/15/15 1438  BP: 124/82  Pulse: 72    Physical Exam   Well-developed white male in no acute distress, pleasant blood pressure 124/82 pulse 72 height 5 foot 9 weight 172. HEENT; nontraumatic, EOMI PERRLA sclera anicteric, Cardiovascular ;regular rate and rhythm with S1-S2 no murmur or gallop, Pulmonary ;clear bilaterally, Abdomen ;soft bowel sounds are present she is nontender there is no palpable mass or hepatosplenomegaly , Rectal; exam not done today this was done per Dr. Dallas Schimke 2 days ago with finding of small external hemorrhoids non-bleeding and possible small internal hemorrhoid.  Ext; no clubbing cyanosis or edema skin warm and dry, Neuropsych; mood and affect appropriate     Assessment & Plan:   #1 55 yo male with  With recent onset  of BRB per rectum - r/o internal hemorrhoidal,vs proctitis vs occult lesion  Plan; Scheduled for Colonoscopy with Dr Adela Lank- procedure discussed in detail   With pt including risks and benefits ,and he is agreeable to proceed.  Nicklaus Alviar S Champayne Kocian PA-C 10/15/2015   Cc: Hannah Beat, MD

## 2015-10-23 ENCOUNTER — Telehealth: Payer: Self-pay | Admitting: Gastroenterology

## 2015-10-23 ENCOUNTER — Telehealth: Payer: Self-pay

## 2015-10-23 NOTE — Telephone Encounter (Signed)
Addressed by Heber Bixby, LPN.

## 2015-10-23 NOTE — Telephone Encounter (Signed)
Pt said he is seeing more blood in toilet water when he has BM; pt scheduled for colonoscopy 11/14/15 and pt wants to know if could get colonoscopy sooner; pt not having any abd pain and feels OK but noticing more blood after BM.[facility] said since pt saw Mike Gip PA on 10/15/15 pt should call [facility]. I transferred pt to [facility]. FYI to Dr Patsy Lager.

## 2015-10-23 NOTE — Telephone Encounter (Signed)
Agreed - I would greatly appreciate GI input here.

## 2015-10-23 NOTE — Telephone Encounter (Signed)
Nothing to do different until see what colonoscopy shows- please ask Amber if her has any spots to work pt in sooner for colonoscopy

## 2015-10-23 NOTE — Telephone Encounter (Signed)
Spoke with the patient. He sees blood with his bowel movements. 1 bm daily. Not spotting of blood without bowel movement. Denies abdominal pain, dizziness or nausea. Patient expresses anxiety about the cause and wants to be moved to a sooner appointment. Spoke with Mike Gip, PAC and to [facility], CMA. Patient is on the wait listed because there are no earlier openings.  Spoke again with the patient. Discussed symptoms that would indicate a need to go to the ER. Also discussed smoking cessation and alcohol consumption. Patient expresses understanding.

## 2015-11-14 ENCOUNTER — Encounter: Payer: Self-pay | Admitting: Gastroenterology

## 2015-11-14 ENCOUNTER — Ambulatory Visit (AMBULATORY_SURGERY_CENTER): Payer: BLUE CROSS/BLUE SHIELD | Admitting: Gastroenterology

## 2015-11-14 VITALS — BP 129/78 | HR 58 | Temp 98.4°F | Resp 16 | Ht 69.0 in | Wt 172.0 lb

## 2015-11-14 DIAGNOSIS — K625 Hemorrhage of anus and rectum: Secondary | ICD-10-CM | POA: Diagnosis present

## 2015-11-14 MED ORDER — SODIUM CHLORIDE 0.9 % IV SOLN: 500.0000 mL | INTRAVENOUS | Status: DC

## 2015-11-14 NOTE — Progress Notes (Signed)
No problems noted in the recovery room. maw 

## 2015-11-14 NOTE — Patient Instructions (Signed)
YOU HAD AN ENDOSCOPIC PROCEDURE TODAY AT THE [facility]:   Refer to the procedure report that was given to you for any specific questions about what was found during the examination.  If the procedure report does not answer your questions, please call your gastroenterologist to clarify.  If you requested that your care partner not be given the details of your procedure findings, then the procedure report has been included in a sealed envelope for you to review at your convenience later.  YOU SHOULD EXPECT: Some feelings of bloating in the abdomen. Passage of more gas than usual.  Walking can help get rid of the air that was put into your GI tract during the procedure and reduce the bloating. If you had a lower endoscopy (such as a colonoscopy or flexible sigmoidoscopy) you may notice spotting of blood in your stool or on the toilet paper. If you underwent a bowel prep for your procedure, you may not have a normal bowel movement for a few days.  Please Note:  You might notice some irritation and congestion in your nose or some drainage.  This is from the oxygen used during your procedure.  There is no need for concern and it should clear up in a day or so.  SYMPTOMS TO REPORT IMMEDIATELY:   Following lower endoscopy (colonoscopy or flexible sigmoidoscopy):  Excessive amounts of blood in the stool  Significant tenderness or worsening of abdominal pains  Swelling of the abdomen that is new, acute  Fever of 100F or higher   For urgent or emergent issues, a gastroenterologist can be reached at any hour by calling [phone].   DIET: Your first meal following the procedure should be a small meal and then it is ok to progress to your normal diet. Heavy or fried foods are harder to digest and may make you feel nauseous or bloated.  Likewise, meals heavy in dairy and vegetables can increase bloating.  Drink plenty of fluids but you should avoid alcoholic beverages for 24  hours.  ACTIVITY:  You should plan to take it easy for the rest of today and you should NOT DRIVE or use heavy machinery until tomorrow (because of the sedation medicines used during the test).    FOLLOW UP: Our staff will call the number listed on your records the next business day following your procedure to check on you and address any questions or concerns that you may have regarding the information given to you following your procedure. If we do not reach you, we will leave a message.  However, if you are feeling well and you are not experiencing any problems, there is no need to return our call.  We will assume that you have returned to your regular daily activities without incident.  If any biopsies were taken you will be contacted by phone or by letter within the next 1-3 weeks.  Please call us at [phone] if you have not heard about the biopsies in 3 weeks.    SIGNATURES/CONFIDENTIALITY: You and/or your care partner have signed paperwork which will be entered into your electronic medical record.  These signatures attest to the fact that that the information above on your After Visit Summary has been reviewed and is understood.  Full responsibility of the confidentiality of this discharge information lies with you and/or your care-partner.    Handouts were given to your care partner on hemorrhoids, diverticulosis, and a high fiber diet with liberal fluid intake. You may resume   your current medications today. Please call if any questions or concerns.   

## 2015-11-14 NOTE — Op Note (Signed)
[facility] [address] [place], [id number]   COLONOSCOPY PROCEDURE REPORT  PATIENT: Chad Petersen, Chad Petersen  MR#: [id number] BIRTHDATE: 10/05/1960 , 54  yrs. old GENDER: male ENDOSCOPIST: Benancio DeedsSteven P Armbruster, MD REFERRED BY: Hannah BeatSpencer Copland MD PROCEDURE DATE:  11/14/2015 PROCEDURE:   Colonoscopy, diagnostic and Colonoscopy, screening First Screening Colonoscopy - Avg.  risk and is 50 yrs.  old or older Yes.  Prior Negative Screening - Now for repeat screening. N/A  History of Adenoma - Now for follow-up colonoscopy & has been > or = to 3 yrs.  N/A  Polyps removed today? No Recommend repeat exam, <10 yrs? No ASA CLASS:   Class II INDICATIONS:Evaluation of unexplained GI bleeding, Screening for colonic neoplasia, and Colorectal Neoplasm Risk Assessment for this procedure is average risk. MEDICATIONS: Propofol 300 mg IV  DESCRIPTION OF PROCEDURE:   After the risks benefits and alternatives of the procedure were thoroughly explained, informed consent was obtained.  The digital rectal exam revealed no abnormalities of the rectum.   The [facility] [id number]  endoscope was introduced through the anus and advanced to the terminal ileum which was intubated for a short distance. No adverse events experienced.   The quality of the prep was adequate  The instrument was then slowly withdrawn as the colon was fully examined. Estimated blood loss is zero unless otherwise noted in this procedure report.  COLON FINDINGS: The bowel preparation was initially fair on intubation however following lavage adequate views were obtained and the exam was adequate for screening purposes.  There was an isolated diverticuli in the right colon.  The remainder of the examined colon appeared normal without polyps or mass lesions.  The ileum was intubated and appeared normal.  Retroflexed views revealed small internal hemorrhoids. The time to cecum = 2.9 Withdrawal time = 14.4   The scope was  withdrawn and the procedure completed. COMPLICATIONS: There were no immediate complications.  ENDOSCOPIC IMPRESSION: Isolated right sided divericuli Otherwise normal appearing colon and ileum - no polyps or mass lesions Small internal hemorrhoids, which I suspect are the most likely cause of the patient's rectal  bleeding  RECOMMENDATIONS: Resume medications Daily fiber supplement or high fiber diet to treat hemorrhoids If symptoms persist come back to see us in the clinic for consideration for banding Repeat colon cancer screening in 10 years  eSigned:  Benancio DeedsSteven P Armbruster, MD 11/14/2015 3:08 PM   cc: Hannah BeatSpencer Copland MD, the patient

## 2015-11-14 NOTE — Progress Notes (Signed)
To recovery, report to Willis, RN, VSS 

## 2015-11-17 ENCOUNTER — Telehealth: Payer: Self-pay

## 2015-11-17 NOTE — Telephone Encounter (Signed)
  Follow up Call-  Call back number 11/14/2015  Post procedure Call Back phone  # [phone]  Permission to leave phone message Yes    Patient was called for follow up after his procedure on 11/14/2015. No answer at the number given for follow up phone call. A message was left on the answering machine.

## 2016-12-13 ENCOUNTER — Ambulatory Visit (INDEPENDENT_AMBULATORY_CARE_PROVIDER_SITE_OTHER): Payer: BLUE CROSS/BLUE SHIELD | Admitting: Family Medicine

## 2016-12-13 ENCOUNTER — Encounter: Payer: Self-pay | Admitting: Family Medicine

## 2016-12-13 VITALS — BP 120/84 | HR 70 | Temp 98.5°F | Ht 69.0 in | Wt 165.2 lb

## 2016-12-13 DIAGNOSIS — K130 Diseases of lips: Secondary | ICD-10-CM | POA: Diagnosis not present

## 2016-12-13 NOTE — Progress Notes (Signed)
Dr. Karleen Hampshire T. Rivers Hamrick, MD, CAQ Sports Medicine [facility] [address] [place], [address] Phone: [phone] Fax: [phone]  12/13/2016  Patient: Chad Petersen, MRN: [id number], DOB: 1961/05/31, 56 y.o.  Primary Physician:  Hannah Beat, MD   Chief Complaint  Patient presents with  . Bump on lip    been there for 4 to 5 months   Subjective:   Chad Petersen is a 56 y.o. very pleasant male patient who presents with the following:  L lower lip - dark area, ? Enlarging.  This is been present for approximately 4-5 months, it is hard, and it has been enlarging according to the patient.  He does not smoke, and he does not chew tobacco.  Past Medical History, Surgical History, Social History, Family History, Problem List, Medications, and Allergies have been reviewed and updated if relevant.  Patient Active Problem List   Diagnosis Date Noted  . Depression 02/17/2011  . ALLERGIC RHINITIS 07/30/2008    Past Medical History:  Diagnosis Date  . Allergic rhinitis due to pollen   . Allergy    SEASONAL  . Arthritis     Past Surgical History:  Procedure Laterality Date  . facial lump  2005   removed, cyst??  . TONSILLECTOMY AND ADENOIDECTOMY      Social History   Social History  . Marital status: Single    Spouse name: N/A  . Number of children: N/A  . Years of education: N/A   Occupational History  . 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 Smoking status: Current Some Day Smoker  . Smokeless tobacco: Never Used  . Alcohol use 0.0 oz/week     Comment: Regular  . Drug use: No  . Sexual activity: Not on file   Other Topics Concern  . Not on file   Social History Narrative   Married, wife had breast cancer         About 2 Liquor drinks a night    Family History  Problem Relation Age of Onset  . Diabetes Father   . Pancreatic cancer Father   . Bleeding Disorder Father     blood clots    No Known  Allergies  Medication list reviewed and updated in full in [place] [facility].   GEN: No acute illnesses, no fevers, chills. GI: No n/v/d, eating normally Pulm: No SOB Interactive and getting along well at home.  Otherwise, ROS is as per the HPI.  Objective:   BP 120/84   Pulse 70   Temp 98.5 F (36.9 C) (Oral)   Ht  (1.753 m)   Wt 165 lb 4 oz (75 kg)   BMI 24.40 kg/m   GEN: WDWN, NAD, Non-toxic, A & O x 3 HEENT: Atraumatic, Normocephalic. Neck supple. No masses, No LAD. On the left side of the lower lip there is a dark area that is somewhat hard to palpation and slightly raised.  It appears to be approximately 3 mm across or so. Ears and Nose: No external deformity. EXTR: No c/c/e NEURO Normal gait.  PSYCH: Normally interactive. Conversant. Not depressed or anxious appearing.  Calm demeanor.   Laboratory and Imaging Data:  Assessment and Plan:   Lip lesion - Plan: Ambulatory referral to ENT  Given the location and darkened appearance and growth over time, I think it is prudent to have this patient evaluated by ENT.  Follow-up: No Follow-up on file.  Orders  Placed This Encounter  Procedures  . Ambulatory referral to ENT    Signed,  Chad Hampshire T. Chad Nevers, MD   Allergies as of 12/13/2016   No Known Allergies     Medication List    as of 12/13/2016 11:59 PM   You have not been prescribed any medications.

## 2016-12-13 NOTE — Progress Notes (Signed)
Pre visit review using our clinic review tool, if applicable. No additional management support is needed unless otherwise documented below in the visit note. 

## 2021-10-23 ENCOUNTER — Other Ambulatory Visit: Payer: Self-pay | Admitting: Ophthalmology

## 2021-10-23 ENCOUNTER — Other Ambulatory Visit
Admission: RE | Admit: 2021-10-23 | Discharge: 2021-10-23 | Disposition: A | Discharge status: Home / Self Care | Payer: BC Managed Care – PPO | Source: Ambulatory Visit | Attending: Ophthalmology | Admitting: Ophthalmology

## 2021-10-23 DIAGNOSIS — H47013 Ischemic optic neuropathy, bilateral: Secondary | ICD-10-CM | POA: Diagnosis present

## 2021-10-23 DIAGNOSIS — H47143 Foster-Kennedy syndrome, bilateral: Secondary | ICD-10-CM

## 2021-10-23 LAB — CBC WITH DIFFERENTIAL/PLATELET
Abs Immature Granulocytes: 0.02 10*3/uL (ref 0.00–0.07)
Basophils Absolute: 0 10*3/uL (ref 0.0–0.1)
Basophils Relative: 0 %
Eosinophils Absolute: 0.2 10*3/uL (ref 0.0–0.5)
Eosinophils Relative: 3 %
HCT: 46.9 % (ref 39.0–52.0)
Hemoglobin: 15.6 g/dL (ref 13.0–17.0)
Immature Granulocytes: 0 %
Lymphocytes Relative: 32 %
Lymphs Abs: 2.2 10*3/uL (ref 0.7–4.0)
MCH: 31.4 pg (ref 26.0–34.0)
MCHC: 33.3 g/dL (ref 30.0–36.0)
MCV: 94.4 fL (ref 80.0–100.0)
Monocytes Absolute: 0.6 10*3/uL (ref 0.1–1.0)
Monocytes Relative: 8 %
Neutro Abs: 3.8 10*3/uL (ref 1.7–7.7)
Neutrophils Relative %: 57 %
Platelets: 272 10*3/uL (ref 150–400)
RBC: 4.97 MIL/uL (ref 4.22–5.81)
RDW: 12.6 % (ref 11.5–15.5)
WBC: 6.8 10*3/uL (ref 4.0–10.5)
nRBC: 0 % (ref 0.0–0.2)

## 2021-10-23 LAB — SEDIMENTATION RATE: Sed Rate: 4 mm/hr (ref 0–20)

## 2021-10-23 LAB — C-REACTIVE PROTEIN: CRP: 0.7 mg/dL (ref <1.0)

## 2021-10-27 ENCOUNTER — Other Ambulatory Visit: Payer: Self-pay

## 2021-10-27 ENCOUNTER — Ambulatory Visit
Admission: RE | Admit: 2021-10-27 | Discharge: 2021-10-27 | Disposition: A | Discharge status: Home / Self Care | Payer: BC Managed Care – PPO | Source: Ambulatory Visit | Attending: Ophthalmology | Admitting: Ophthalmology

## 2021-10-27 ENCOUNTER — Other Ambulatory Visit: Payer: Self-pay | Admitting: Ophthalmology

## 2021-10-27 DIAGNOSIS — H47143 Foster-Kennedy syndrome, bilateral: Secondary | ICD-10-CM

## 2021-10-27 IMAGING — MR MR HEAD WO/W CM
12 series · 48 of 48 positions shown · IV contrast (multihance)
Comparison: None.

CLINICAL DATA: Mri orbits with/without Foster Kennedy's syndrome,
bilateral Loss of vision in both eyes. Right eye X 5 months left eye
1-2 weeks.

EXAM:
MRI HEAD AND ORBITS WITHOUT AND WITH CONTRAST
TECHNIQUE: Multiplanar, multiecho pulse sequences of the brain and surrounding
structures were obtained without and with intravenous contrast.
Multiplanar, multiecho pulse sequences of the orbits and surrounding
structures were obtained including fat saturation techniques, before
and after intravenous contrast administration.
CONTRAST:  15mL MULTIHANCE GADOBENATE DIMEGLUMINE 529 MG/ML IV SOLN

[Series 5: T1 · sagittal · 4.0mm · 0.75mm/px · 2 of 31 slices shown (1 of 2)]
[im 1/31]
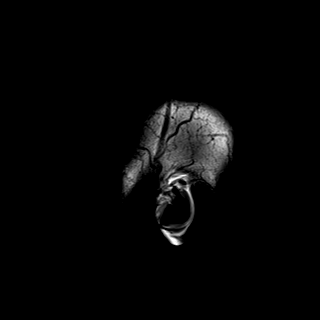
[im 31/31]
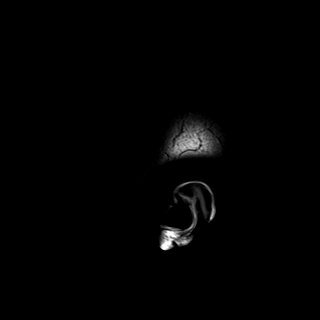

[Series 6: DWI · axial · 3.0mm · 0.94mm/px · z∈[-67,+72]mm · 9 of 160 slices shown]
[im 1/160]
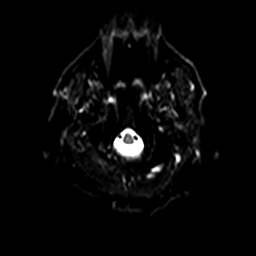
[im 20/160]
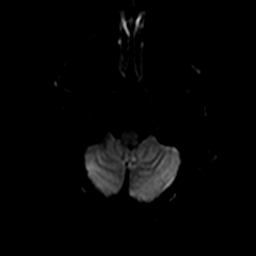
[im 40/160]
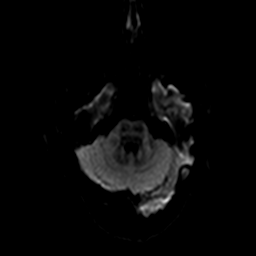
[im 60/160]
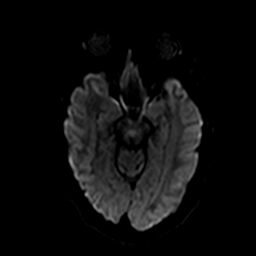
[im 80/160]
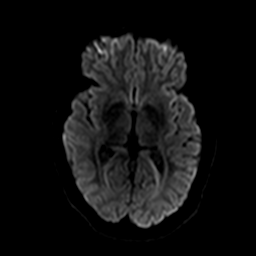
[im 100/160]
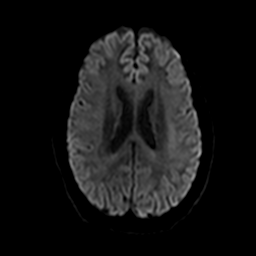
[im 120/160]
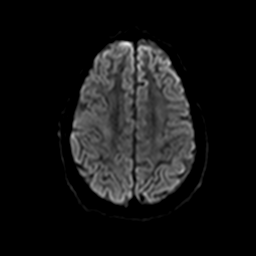
[im 140/160]
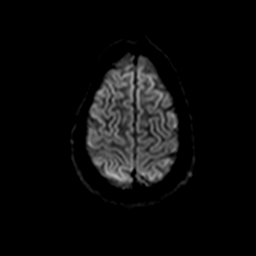
[im 160/160]
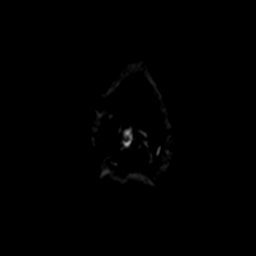

[Series 7: ax dwi_tracew · axial · 3.0mm · 0.94mm/px · z∈[-67,+72]mm · 4 of 80 slices shown]
[im 1/80]
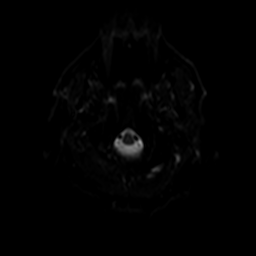
[im 27/80]
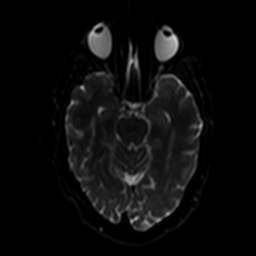
[im 53/80]
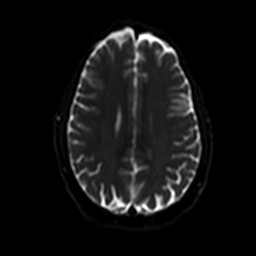
[im 80/80]
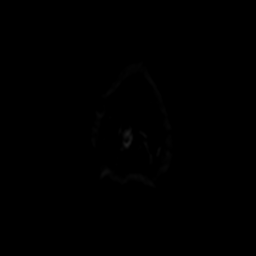

[Series 8: ax dwi_adc · axial · 3.0mm · 0.94mm/px · z∈[-67,+72]mm · 2 of 36 slices shown]
[im 1/36]
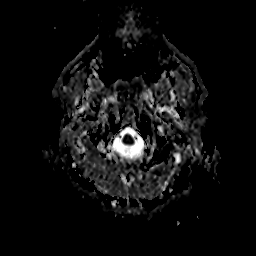
[im 36/36]
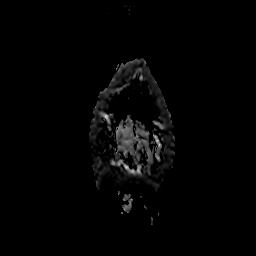

[Series 9: T2 · axial · 4.0mm · 0.36mm/px · z∈[-60,+90]mm · 2 of 30 slices shown (1 of 2)]
[im 1/30]
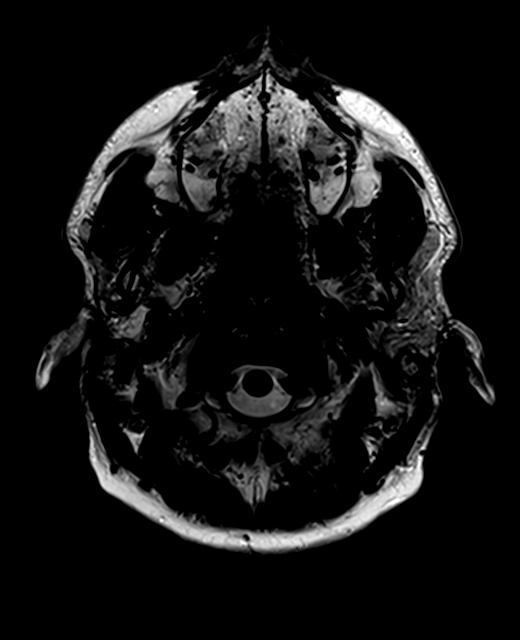
[im 30/30]
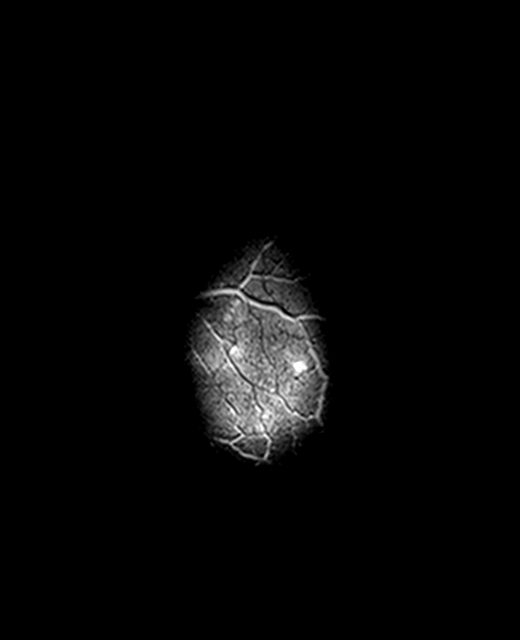

[Series 10: FLAIR · axial · 3.0mm · 0.72mm/px · 1 of 26 slices shown]
[im 1/26]
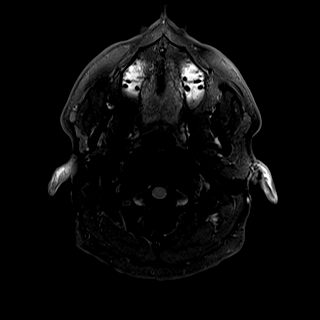

[Series 11: mip_images(sw) · axial · 24.0mm · 0.90mm/px · z∈[-69,+99]mm · 3 of 57 slices shown]
[im 1/57]
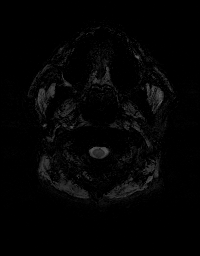
[im 29/57]
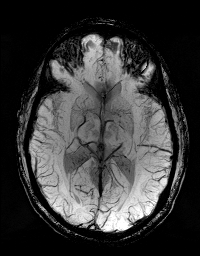
[im 57/57]
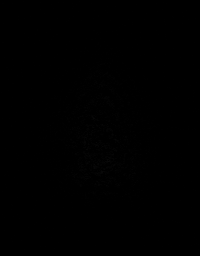

[Series 12: swi_images · axial · 3.0mm · 0.90mm/px · z∈[-79,+109]mm · 4 of 64 slices shown]
[im 1/64]
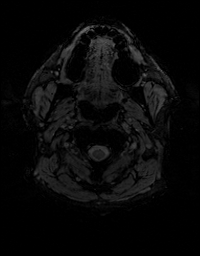
[im 22/64]
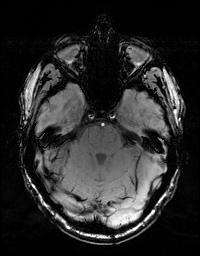
[im 43/64]
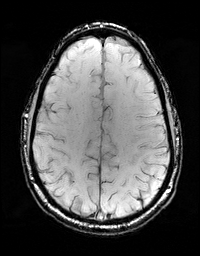
[im 64/64]
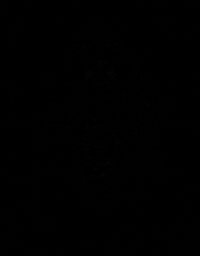

[Series 13: T1 · axial · 1.0mm · 0.90mm/px · z∈[-64,+94]mm · 9 of 160 slices shown (2 of 2)]
[im 1/160]
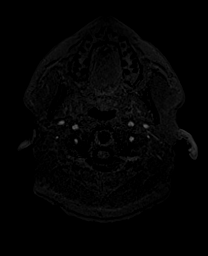
[im 20/160]
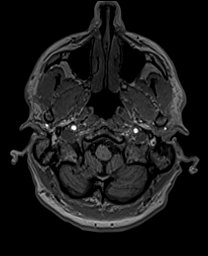
[im 40/160]
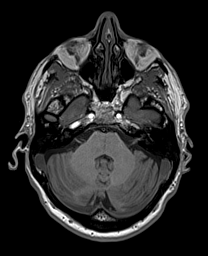
[im 60/160]
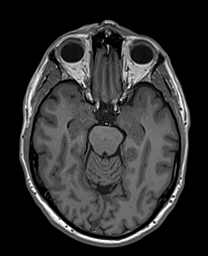
[im 80/160]
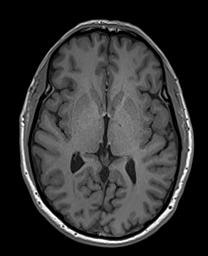
[im 100/160]
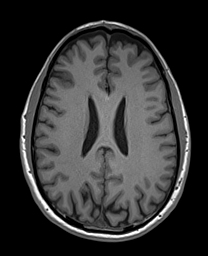
[im 120/160]
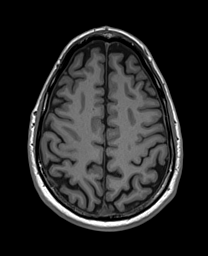
[im 140/160]
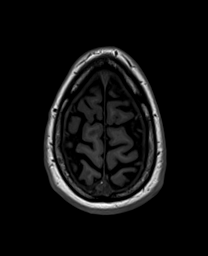
[im 160/160]
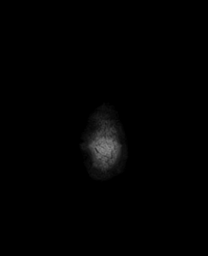

[Series 17: T2 · coronal · 4.0mm · 0.36mm/px · 2 of 33 slices shown (2 of 2)]
[im 1/33]
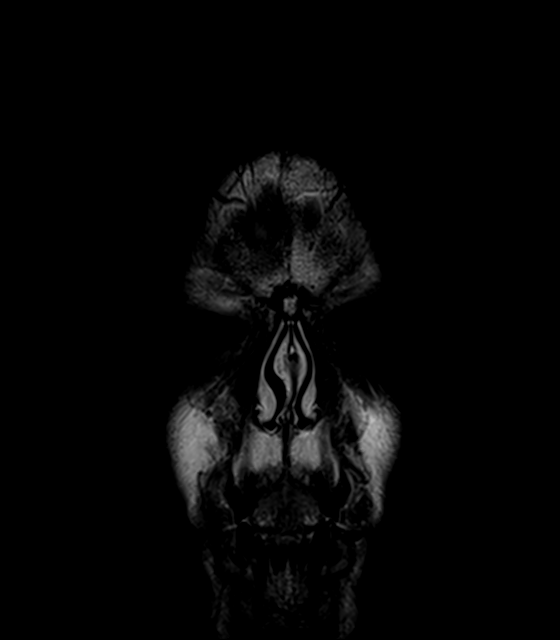
[im 33/33]
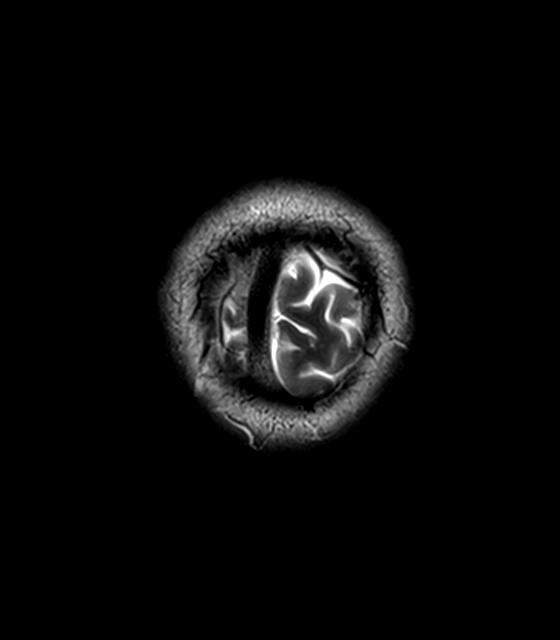

[Series 21: T1 post-contrast · coronal · 4.0mm · 0.90mm/px · 2 of 33 slices shown (1 of 2)]
[im 1/33]
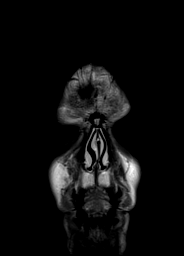
[im 33/33]
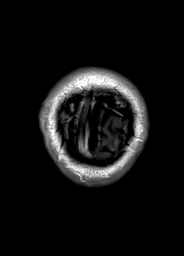

[Series 22: T1 post-contrast · axial · 1.0mm · 0.90mm/px · z∈[-64,+79]mm · 8 of 144 slices shown (2 of 2)]
[im 1/144]
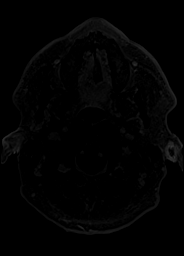
[im 21/144]
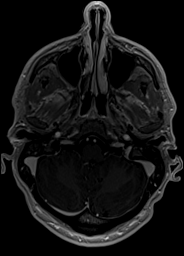
[im 41/144]
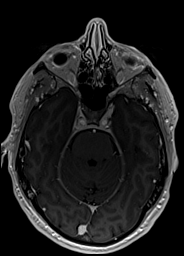
[im 62/144]
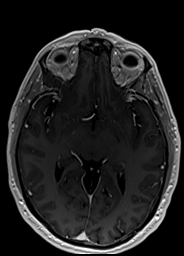
[im 82/144]
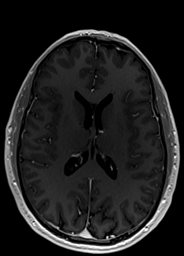
[im 103/144]
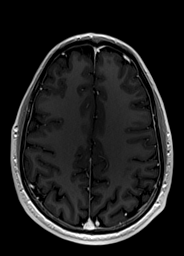
[im 123/144]
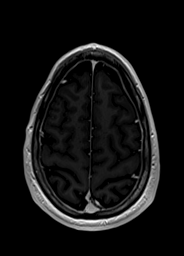
[im 144/144]
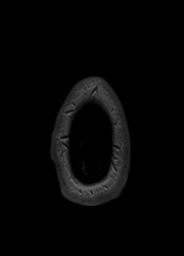

[48 of 48 positions shown; findings below may reference images not displayed]

FINDINGS: MRI HEAD FINDINGS

Brain: No acute infarction, hemorrhage, hydrocephalus, extra-axial
collection or mass lesion.

Normal white matter. Negative for demyelinating disease. Normal
enhancement of the brain.

Vascular: Normal arterial flow voids. Normal dural venous
enhancement.

Skull and upper cervical spine: No focal skeletal lesion.

Other: None

MRI ORBITS FINDINGS

Orbits: Globe is normal bilaterally. Lens normal in location.
Extraocular muscles are normal bilaterally

Atrophic right optic nerve which is abnormally small. No associated
mass or abnormal enhancement. There is question of atrophy of the
left optic nerve towards the orbital apex, less severe than that
seen on the right. No abnormal enhancement.

Optic chiasm normal. Pituitary not enlarged. Cavernous sinus normal
bilaterally.

Visualized sinuses: Minimal mucosal edema maxillary sinus
bilaterally. Mild mastoid effusion bilaterally

Soft tissues: No soft tissue mass or edema.
IMPRESSION: 1. Normal MRI of the brain with contrast
2. Atrophic right optic nerve. No associated mass or abnormal
enhancement. Possible atrophic changes in the left optic nerve
towards the orbital apex.

## 2021-10-27 IMAGING — MR MR ORBITS WO/W CM
10 of 13 series · 36 of 48 positions shown · IV contrast (multihance)
Comparison: None.

CLINICAL DATA: Mri orbits with/without Foster Kennedy's syndrome,
bilateral Loss of vision in both eyes. Right eye X 5 months left eye
1-2 weeks.

EXAM:
MRI HEAD AND ORBITS WITHOUT AND WITH CONTRAST
TECHNIQUE: Multiplanar, multiecho pulse sequences of the brain and surrounding
structures were obtained without and with intravenous contrast.
Multiplanar, multiecho pulse sequences of the orbits and surrounding
structures were obtained including fat saturation techniques, before
and after intravenous contrast administration.
CONTRAST:  15mL MULTIHANCE GADOBENATE DIMEGLUMINE 529 MG/ML IV SOLN

[Series 2: T1 · sagittal · 4.0mm · 0.75mm/px · 1 of 31 slices shown (1 of 4)]
[im 1/31]
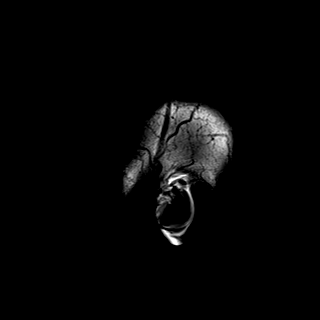

[Series 3: DWI · axial · 3.0mm · 0.94mm/px · z∈[-67,+72]mm · 8 of 160 slices shown]
[im 1/160]
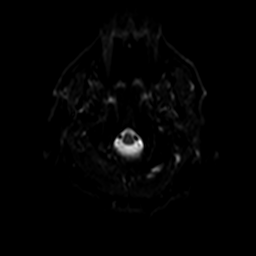
[im 18/160]
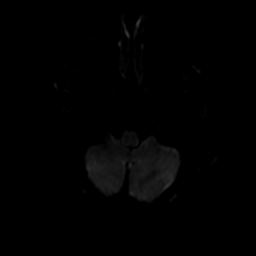
[im 54/160]
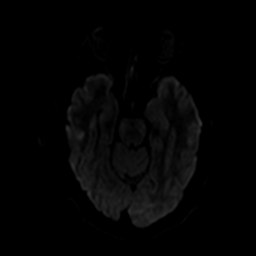
[im 71/160]
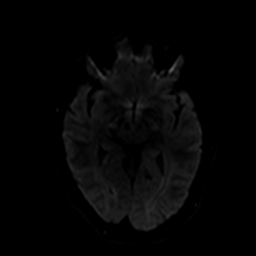
[im 89/160]
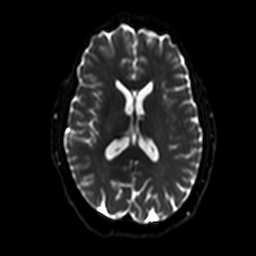
[im 107/160]
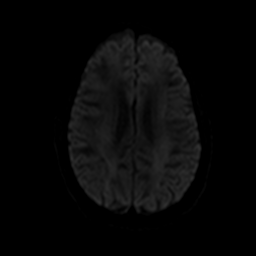
[im 142/160]
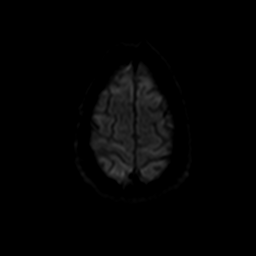
[im 160/160]
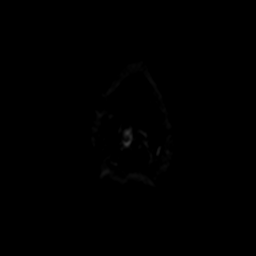

[Series 6: T2 · axial · 4.0mm · 0.36mm/px · z∈[-60,+90]mm · 2 of 30 slices shown]
[im 1/30]
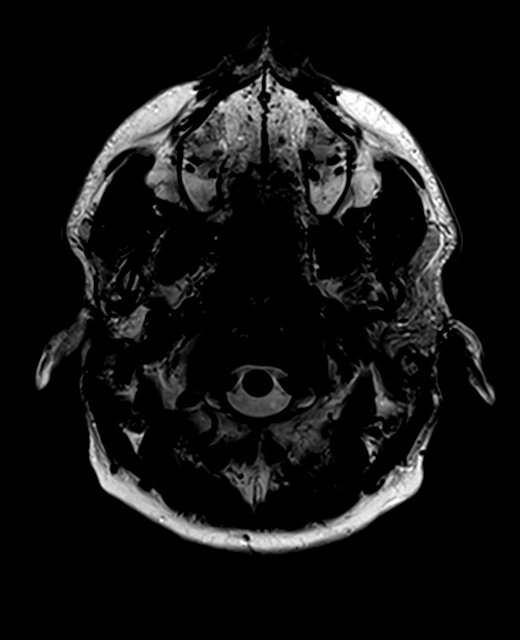
[im 30/30]
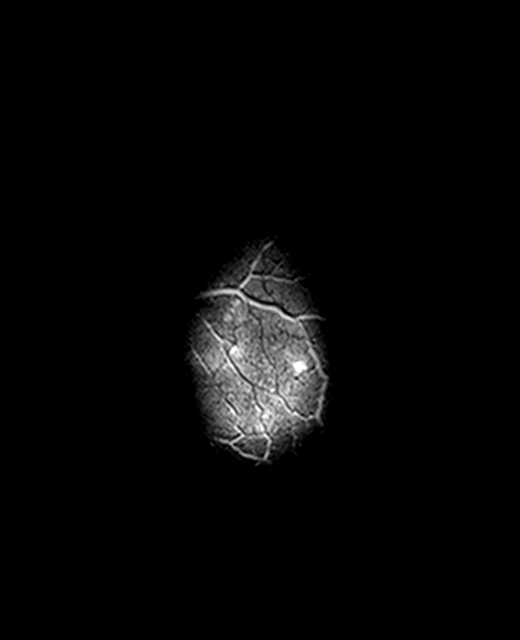

[Series 7: T1 · axial · 1.0mm · 0.90mm/px · z∈[-64,+94]mm · 8 of 160 slices shown (2 of 4)]
[im 1/160]
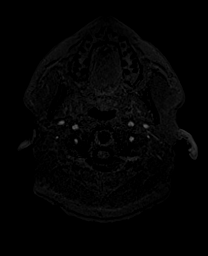
[im 18/160]
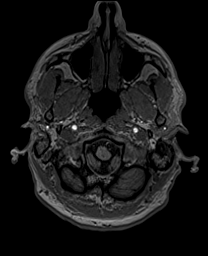
[im 54/160]
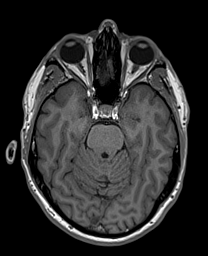
[im 71/160]
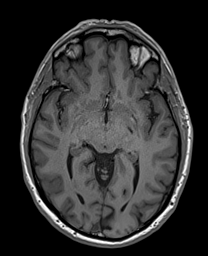
[im 89/160]
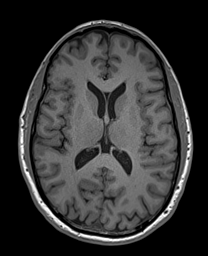
[im 107/160]
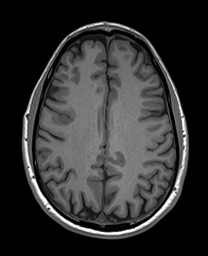
[im 142/160]
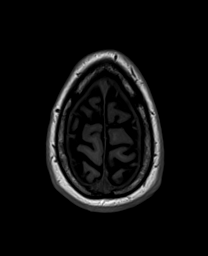
[im 160/160]
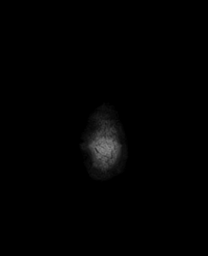

[Series 8: T2 fat-sat · axial · 2.5mm · 0.25mm/px · 1 of 19 slices shown (1 of 2)]
[im 1/19]
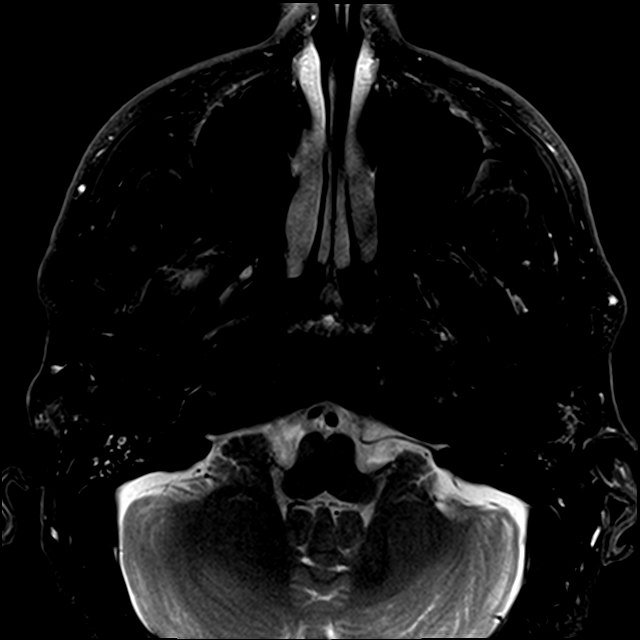

[Series 9: T2 fat-sat · coronal · 2.5mm · 0.25mm/px · 2 of 33 slices shown (2 of 2)]
[im 1/33]
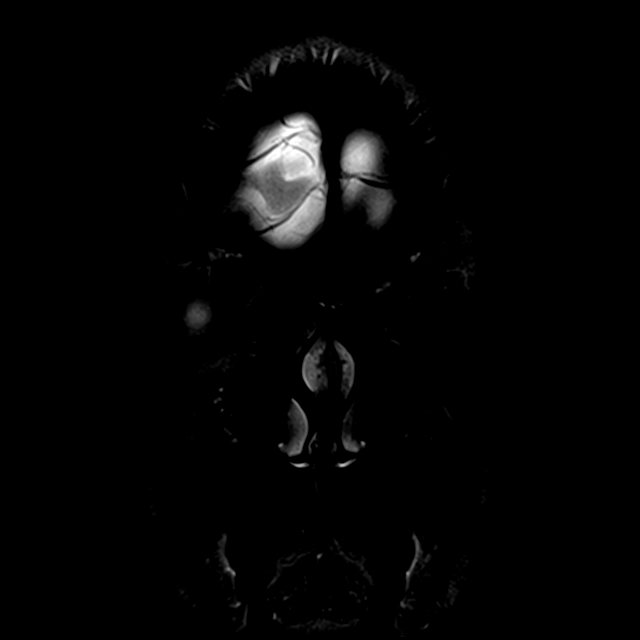
[im 33/33]
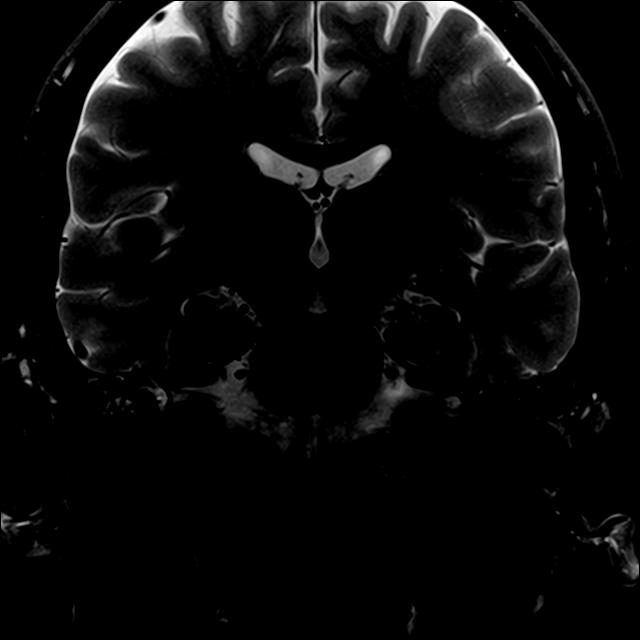

[Series 10: T1 · axial · 2.5mm · 0.25mm/px · 1 of 19 slices shown (3 of 4)]
[im 1/19]
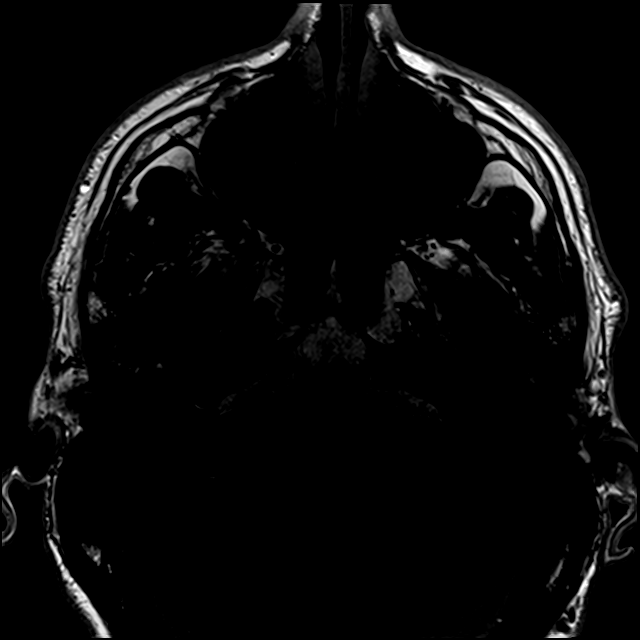

[Series 11: T1 · coronal · 2.5mm · 0.25mm/px · 2 of 33 slices shown (4 of 4)]
[im 1/33]
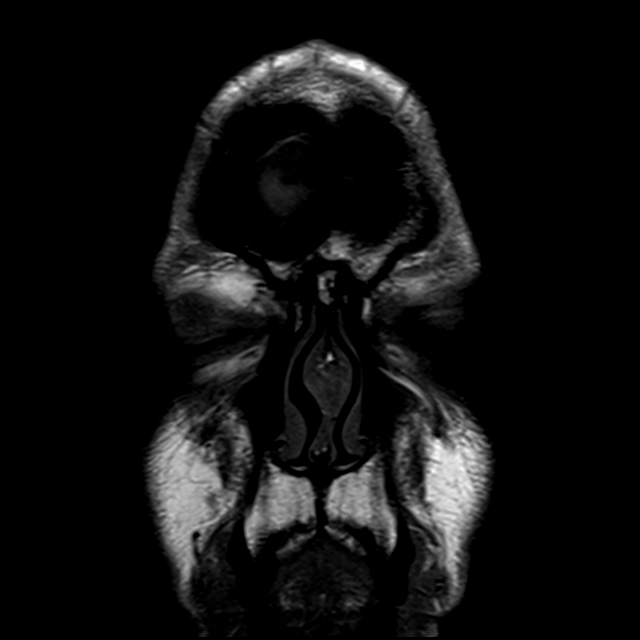
[im 33/33]
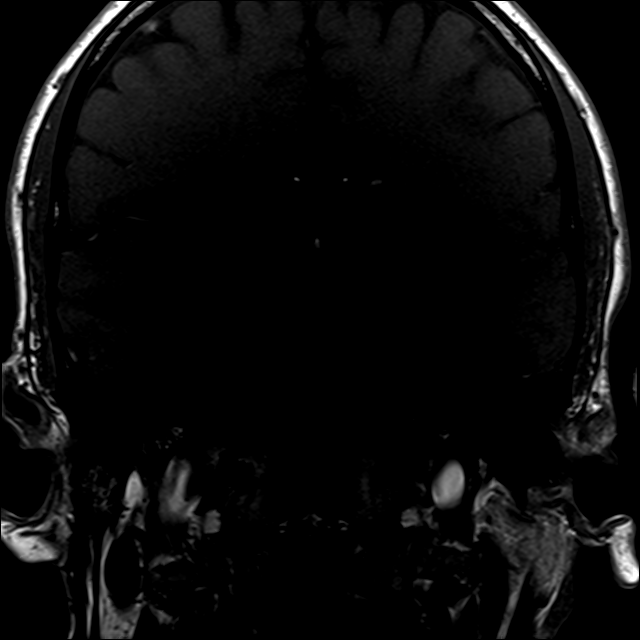

[Series 13: T1 post-contrast · coronal · 2.5mm · 0.50mm/px · 2 of 33 slices shown (1 of 2)]
[im 1/33]
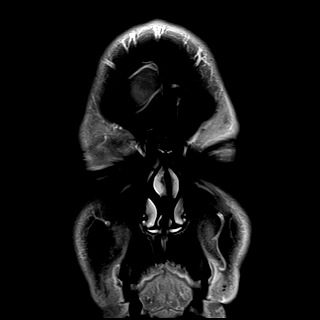
[im 33/33]
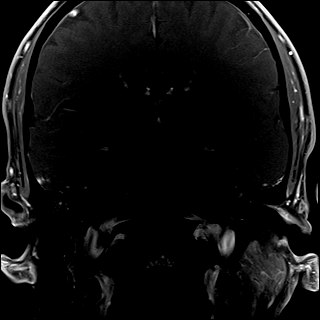

[Series 14: T1 post-contrast · axial · 1.0mm · 0.90mm/px · z∈[-64,+79]mm · 9 of 144 slices shown (2 of 2)]
[im 1/144]
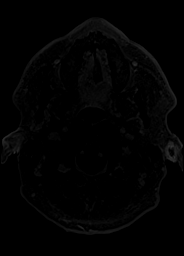
[im 18/144]
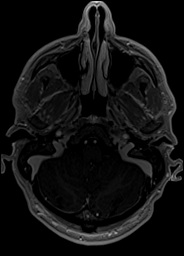
[im 36/144]
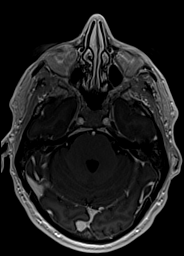
[im 54/144]
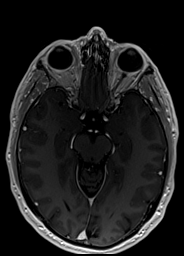
[im 72/144]
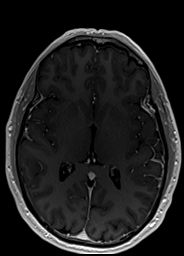
[im 90/144]
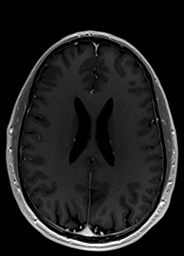
[im 108/144]
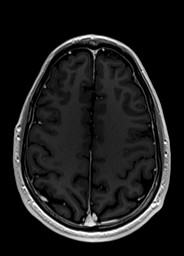
[im 126/144]
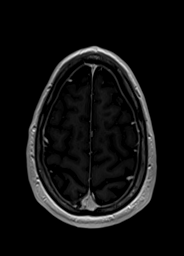
[im 144/144]
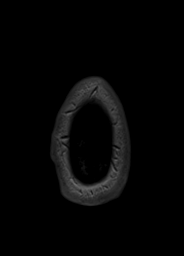

[36 of 48 positions shown; findings below may reference images not displayed]

FINDINGS: MRI HEAD FINDINGS

Brain: No acute infarction, hemorrhage, hydrocephalus, extra-axial
collection or mass lesion.

Normal white matter. Negative for demyelinating disease. Normal
enhancement of the brain.

Vascular: Normal arterial flow voids. Normal dural venous
enhancement.

Skull and upper cervical spine: No focal skeletal lesion.

Other: None

MRI ORBITS FINDINGS

Orbits: Globe is normal bilaterally. Lens normal in location.
Extraocular muscles are normal bilaterally

Atrophic right optic nerve which is abnormally small. No associated
mass or abnormal enhancement. There is question of atrophy of the
left optic nerve towards the orbital apex, less severe than that
seen on the right. No abnormal enhancement.

Optic chiasm normal. Pituitary not enlarged. Cavernous sinus normal
bilaterally.

Visualized sinuses: Minimal mucosal edema maxillary sinus
bilaterally. Mild mastoid effusion bilaterally

Soft tissues: No soft tissue mass or edema.
IMPRESSION: 1. Normal MRI of the brain with contrast
2. Atrophic right optic nerve. No associated mass or abnormal
enhancement. Possible atrophic changes in the left optic nerve
towards the orbital apex.

## 2021-10-27 MED ORDER — GADOBENATE DIMEGLUMINE 529 MG/ML IV SOLN
15.0000 mL | Freq: Once | INTRAVENOUS | Status: AC | PRN
  Administered 2021-10-27: 15 mL via INTRAVENOUS

## 2021-10-28 ENCOUNTER — Other Ambulatory Visit: Payer: BC Managed Care – PPO

## 2021-10-28 LAB — VITAMIN B1: Vitamin B1 (Thiamine): 145 nmol/L (ref 66.5–200.0)

## 2021-11-03 ENCOUNTER — Other Ambulatory Visit: Payer: BC Managed Care – PPO

## 2023-07-26 ENCOUNTER — Ambulatory Visit: Payer: BC Managed Care – PPO | Admitting: Internal Medicine

## 2023-08-10 ENCOUNTER — Ambulatory Visit: Payer: BC Managed Care – PPO | Admitting: Internal Medicine

## 2023-09-20 ENCOUNTER — Ambulatory Visit: Payer: BC Managed Care – PPO | Admitting: Internal Medicine

## 2024-08-15 ENCOUNTER — Ambulatory Visit (INDEPENDENT_AMBULATORY_CARE_PROVIDER_SITE_OTHER)

## 2024-08-15 ENCOUNTER — Ambulatory Visit: Admission: EM | Admit: 2024-08-15 | Discharge: 2024-08-15 | Disposition: A | Discharge status: Home / Self Care

## 2024-08-15 DIAGNOSIS — M25551 Pain in right hip: Secondary | ICD-10-CM

## 2024-08-15 DIAGNOSIS — M25851 Other specified joint disorders, right hip: Secondary | ICD-10-CM | POA: Diagnosis not present

## 2024-08-15 DIAGNOSIS — K047 Periapical abscess without sinus: Secondary | ICD-10-CM

## 2024-08-15 DIAGNOSIS — S025XXA Fracture of tooth (traumatic), initial encounter for closed fracture: Secondary | ICD-10-CM

## 2024-08-15 HISTORY — DX: Essential (primary) hypertension: I10

## 2024-08-15 MED ORDER — AMOXICILLIN-POT CLAVULANATE 875-125 MG PO TABS: 1.0000 | ORAL_TABLET | Freq: Two times a day (BID) | ORAL | 0 refills | Status: DC

## 2024-08-15 NOTE — ED Triage Notes (Signed)
 Patient staets that he dislocated his right hip 2 nights ago. Patient states that he was sitting with his legs crossed and went to get up and his right hip felt like it got stuck. Patient states that he hit his leg to get it back in place. Patient sates that his sister is a engineer, civil (consulting) and told him to come get it xray. Patient staets that he also has a dental abscess on the top right of his mouth x 1 month.

## 2024-08-15 NOTE — ED Provider Notes (Signed)
 [facility]    CSN: [id number] Arrival date & time: 08/15/24  1136      History   Chief Complaint Chief Complaint  Patient presents with   Hip Pain   Dental Problem    HPI Chad Petersen is a 63 y.o. male.   Chad Petersen, 63 year old male pt, presents to urgent care for evaluation of possible dislocated right hip 2 days ago.  Patient states he was sitting with his legs crossed and went to stand up and his hip felt like it got stuck so he banged it back into place.    Patient reports his sister is a nurse she advised him to come get an x-ray patient states he also has a dental infection from broken teeth right side of his mouth for the last month does not have a dentist.  Patient states he is using tart cherry juice to help with his arthritis.  The history is provided by the patient. No language interpreter was used.    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   TONSILLECTOMY AND ADENOIDECTOMY         Home Medications    Prior to Admission medications   Medication Sig Start Date End Date Taking? Authorizing Provider  amoxicillin-clavulanate (AUGMENTIN) 875-125 MG tablet Take 1 tablet by mouth every 12 (twelve) hours. 08/15/24  Yes Chad Petersen, Rilla, NP  aspirin EC 81 MG tablet Take 81 mg by mouth daily. Swallow whole.   Yes [provider]    Family History Family History  Problem Relation Age of Onset   Diabetes Father    Pancreatic cancer Father    Bleeding Disorder Father        blood clots    Social History Social  History   Tobacco Use   Smoking status: Some Days   Smokeless tobacco: Never  Substance Use Topics   Alcohol use: Yes    Alcohol/week: 0.0 standard drinks of alcohol    Comment: Regular   Drug use: No     Allergies   Patient has no known allergies.   Review of Systems Review of Systems  Constitutional:  Negative for fever.  HENT:  Positive for dental problem.   Musculoskeletal:  Positive for arthralgias and myalgias.  Skin: Negative.   All other systems reviewed and are negative.    Physical Exam Triage Vital Signs ED Triage Vitals  Encounter Vitals Group     BP 08/15/24 1211 (!) 161/94     Girls Systolic BP Percentile --      Girls Diastolic BP Percentile --      Boys Systolic BP Percentile --      Boys Diastolic BP Percentile --      Pulse Rate 08/15/24 1211 64     Resp 08/15/24 1210 19     Temp 08/15/24 1211 98.3 F (36.8 C)     Temp Source 08/15/24 1210 Oral     SpO2 08/15/24 1211 100 %  Weight 08/15/24 1210 185 lb (83.9 kg)     Height --      Head Circumference --      Peak Flow --      Pain Score 08/15/24 1210 4     Pain Loc --      Pain Education --      Exclude from Growth Chart --    No data found.  Updated Vital Signs BP (!) 161/94 (BP Location: Left Arm)   Pulse 64   Temp 98.3 F (36.8 C) (Oral)   Resp 19   Wt 185 lb (83.9 kg)   SpO2 100%   BMI 27.32 kg/m   Visual Acuity Right Eye Distance:   Left Eye Distance:   Bilateral Distance:    Right Eye Near:   Left Eye Near:    Bilateral Near:     Physical Exam Vitals and nursing note reviewed.  Constitutional:      Appearance: He is well-developed and well-groomed.  HENT:     Head: Normocephalic.     Mouth/Throat:      Comments: Broken teeth, +TTP gingiva right upper,no tenting,no fluctuance Cardiovascular:     Rate and Rhythm: Normal rate.  Pulmonary:     Effort: Pulmonary effort is normal.  Musculoskeletal:     Comments: Pain with standing, movement of right hip,  negative TTP of right hip joint.   Neurological:     General: No focal deficit present.     Mental Status: He is alert and oriented to person, place, and time.     GCS: GCS eye subscore is 4. GCS verbal subscore is 5. GCS motor subscore is 6.  Psychiatric:        Attention and Perception: Attention normal.        Mood and Affect: Mood normal.        Speech: Speech normal.        Behavior: Behavior normal. Behavior is cooperative.      UC Treatments / Results  Labs (all labs ordered are listed, but only abnormal results are displayed) Labs Reviewed - No data to display  EKG   Radiology DG Hip Unilat With Pelvis 2-3 Views Right Result Date: 08/15/2024 EXAM: 2 or more VIEW(S) XRAY OF THE PELVIS AND RIGHT HIP 08/15/2024 01:02:31 PM COMPARISON: None available. CLINICAL HISTORY: Hip pain, acute. FINDINGS: BONES AND JOINTS: SI joints are symmetric. No acute fracture. For the right hip, there is mild spurring of the femoral head with mildly aspherical femoral head morphology which may predispose to cam-type femoroacetabular impingement. There is mild spurring of the right acetabulum. Mild craniocaudad loss of articular space suggests mild chondral thinning. The left hip demonstrates normal alignment. SOFT TISSUES: The soft tissues are unremarkable. IMPRESSION: 1. 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 2. Mild spurring of the right acetabulum. 3. Mild craniocaudad loss of articular space suggesting mild chondral thinning. Electronically signed by: Chad Salvage MD 08/15/2024 01:46 PM EST RP Workstation: [id number]    Procedures Procedures (including critical care time)  Medications Ordered in UC Medications - No data to display  Initial Impression / Assessment and Plan / UC Course  I have reviewed the triage vital signs and the nursing notes.  Pertinent labs & imaging  results that were available during my care of the patient were reviewed by me and considered in my medical decision making (see chart for details).    Discussed exam  findings and plan of care with patient, Augmentin scripted for dental infection, referral to Ortho regarding hip pain strict go to ER precautions given.   Patient verbalized understanding to this provider.  Ddx: Right hip pain, hip impingement syndrome, dental infection, broken teeth Final Clinical Impressions(s) / UC Diagnoses   Final diagnoses:  Pain of right hip  Hip impingement syndrome, right  Dental infection  Broken teeth     Discharge Instructions      Your xray was negative for fracture or dislocation. Follow up with Orthopedist of your choice-call for appt Take antibiotic as directed for dental infection, please get established with dentist of your choice. May take tylenol for any pain a label directed      ED Prescriptions     Medication Sig Dispense Auth. Provider   amoxicillin-clavulanate (AUGMENTIN) 875-125 MG tablet Take 1 tablet by mouth every 12 (twelve) hours. 14 tablet Sidra Oldfield, Rilla, NP      PDMP not reviewed this encounter.   Aminta Rilla, NP 08/15/24 1756

## 2024-08-15 NOTE — Discharge Instructions (Addendum)
 Your xray was negative for fracture or dislocation. Follow up with Orthopedist of your choice-call for appt Take antibiotic as directed for dental infection, please get established with dentist of your choice. May take tylenol for any pain a label directed

## 2024-09-07 ENCOUNTER — Ambulatory Visit: Payer: Self-pay | Admitting: Internal Medicine

## 2024-09-14 ENCOUNTER — Ambulatory Visit (INDEPENDENT_AMBULATORY_CARE_PROVIDER_SITE_OTHER): Payer: Medicare (Managed Care) | Admitting: Cardiology

## 2024-09-14 ENCOUNTER — Encounter: Payer: Self-pay | Admitting: Cardiology

## 2024-09-14 VITALS — BP 132/96 | HR 70 | Temp 97.9°F | Ht 69.0 in | Wt 185.6 lb

## 2024-09-14 DIAGNOSIS — Z131 Encounter for screening for diabetes mellitus: Secondary | ICD-10-CM

## 2024-09-14 DIAGNOSIS — I10 Essential (primary) hypertension: Secondary | ICD-10-CM | POA: Insufficient documentation

## 2024-09-14 DIAGNOSIS — Z0001 Encounter for general adult medical examination with abnormal findings: Secondary | ICD-10-CM

## 2024-09-14 DIAGNOSIS — Z1329 Encounter for screening for other suspected endocrine disorder: Secondary | ICD-10-CM

## 2024-09-14 DIAGNOSIS — Z125 Encounter for screening for malignant neoplasm of prostate: Secondary | ICD-10-CM

## 2024-09-14 DIAGNOSIS — Z0000 Encounter for general adult medical examination without abnormal findings: Secondary | ICD-10-CM | POA: Insufficient documentation

## 2024-09-14 DIAGNOSIS — Z13220 Encounter for screening for lipoid disorders: Secondary | ICD-10-CM

## 2024-09-14 NOTE — Progress Notes (Signed)
 "  Established Patient Office Visit  Subjective:  Patient ID: Chad Petersen, male    DOB: [birth year]  Age: 64 y.o. MRN: [id number]  Chief Complaint  Patient presents with   Annual Exam    AWV. Pt. Experiencing high bp, would also like for his right ear to be looked at.    Patient in office for annual medicare wellness. Patient doing well, no complaints today. Blood pressure elevated today. Patient not taking an anti-hypertensive mediation. States he was on one in the past, unsure what medication it was. Patient to call back with name of medication.  Will do blood work today despite not fasting.  Up to date on colonoscopy.     No other concerns at this time.   Past Medical History:  Diagnosis Date   Allergic rhinitis due to pollen    Allergy    SEASONAL   Arthritis    Depression 02/17/2011   Hypertension     Past Surgical History:  Procedure Laterality Date   facial lump  2005   removed, cyst??   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    Employer: [employer]  Tobacco Use   Smoking status: Some Days   Smokeless tobacco: Never  Substance and Sexual Activity   Alcohol use: Yes    Alcohol/week: 0.0 standard drinks of alcohol    Comment: Regular   Drug use: No   Sexual activity: Not on file  Other Topics Concern   Not on file  Social History Narrative   Married, wife had breast cancer         About 2 Liquor drinks a night   Social Drivers of Health   Tobacco Use: High Risk (09/14/2024)   Patient History    Smoking Tobacco Use: Some Days    Smokeless Tobacco Use: Never    Passive Exposure: Not on file  Financial Resource Strain: Not on file  Food Insecurity: Not on file  Transportation Needs: No Transportation Needs  (09/14/2024)   Epic    Lack of Transportation (Medical): No    Lack of Transportation (Non-Medical): No  Physical Activity: Not on file  Stress: Not on file  Social Connections: Not on file  Intimate Partner Violence: Not on file  Depression (PHQ2-9): Low Risk (09/14/2024)   Depression (PHQ2-9)    PHQ-2 Score: 3  Recent Concern: Depression (PHQ2-9) - Medium Risk (09/14/2024)   Depression (PHQ2-9)    PHQ-2 Score: 7  Alcohol Screen: Not on file  Housing: Not on file  Utilities: Not on file  Health Literacy: Not on file    Family History  Problem Relation Age of Onset   Diabetes Father    Pancreatic cancer Father    Bleeding Disorder Father        blood clots    Allergies[1]  Show/hide medication list[2]  Review of Systems  Constitutional: Negative.   HENT: Negative.    Eyes: Negative.   Respiratory: Negative.  Negative for shortness of breath.   Cardiovascular: Negative.  Negative for chest pain.  Gastrointestinal: Negative.  Negative for abdominal pain, constipation and diarrhea.  Genitourinary: Negative.   Musculoskeletal:  Negative for joint pain and myalgias.  Skin: Negative.  Neurological: Negative.  Negative for dizziness and headaches.  Endo/Heme/Allergies: Negative.   All other systems reviewed and are negative.      Objective:   BP (!) 132/96 (BP Location: Left Arm, Patient Position: Sitting, Cuff Size: Large)   Pulse 70   Temp 97.9 F (36.6 C)   Ht 5' 9 (1.753 m)   Wt 185 lb 9.6 oz (84.2 kg)   SpO2 96%   BMI 27.41 kg/m   Vitals:   09/14/24 1020 09/14/24 1114  BP: (!) 152/84 (!) 132/96  Pulse: 70   Temp: 97.9 F (36.6 C)   Height: 5' 9 (1.753 m)   Weight: 185 lb 9.6 oz (84.2 kg)   SpO2: 96%   BMI (Calculated): 27.4     Physical Exam Nursing note reviewed.  Constitutional:      Appearance: Normal appearance. He is normal weight.  HENT:     Head: Normocephalic and atraumatic.     Nose: Nose normal.     Mouth/Throat:     Mouth: Mucous  membranes are moist.     Pharynx: Oropharynx is clear.  Eyes:     Extraocular Movements: Extraocular movements intact.     Conjunctiva/sclera: Conjunctivae normal.     Pupils: Pupils are equal, round, and reactive to light.  Cardiovascular:     Rate and Rhythm: Normal rate and regular rhythm.     Pulses: Normal pulses.     Heart sounds: Normal heart sounds.  Pulmonary:     Effort: Pulmonary effort is normal.     Breath sounds: Normal breath sounds.  Abdominal:     General: Abdomen is flat. Bowel sounds are normal.     Palpations: Abdomen is soft.  Musculoskeletal:        General: Normal range of motion.     Cervical back: Normal range of motion.  Skin:    General: Skin is warm and dry.  Neurological:     General: No focal deficit present.     Mental Status: He is alert and oriented to person, place, and time.  Psychiatric:        Mood and Affect: Mood normal.        Behavior: Behavior normal.        Thought Content: Thought content normal.        Judgment: Judgment normal.      No results found for any visits on 09/14/24.  No results found for this or any previous visit (from the past 2160 hours).    Assessment & Plan:  Restart blood pressure medication.  Lab work today. Return in 4 weeks for blood pressure reassessment.  Problem List Items Addressed This Visit       Cardiovascular and Mediastinum   Primary hypertension     Other   Encounter for annual health examination - Primary   Other Visit Diagnoses       Diabetes mellitus screening       Relevant Orders   CMP14+EGFR   Hemoglobin A1c     Thyroid disorder screening       Relevant Orders   TSH     Lipid screening       Relevant Orders   Lipid Profile     Prostate cancer screening       Relevant Orders   PSA       Return in about 4 weeks (around 10/12/2024) for with TJ.   Total time spent: 25 minutes. This time includes review of previous notes and  results and patient face to face interaction  during today's visit.    Jeoffrey Pollen, NP  09/14/2024   This document may have been prepared by [facility] Voice Recognition software and as such may include unintentional dictation errors.     [1] No Known Allergies [2]  Outpatient Medications Prior to Visit  Medication Sig   aspirin EC 81 MG tablet Take 81 mg by mouth daily. Swallow whole.   [DISCONTINUED] amoxicillin -clavulanate (AUGMENTIN ) 875-125 MG tablet Take 1 tablet by mouth every 12 (twelve) hours.   No facility-administered medications prior to visit.   "

## 2024-09-15 LAB — CMP14+EGFR
ALT: 33 IU/L (ref 0–44)
AST: 25 IU/L (ref 0–40)
Albumin: 4.4 g/dL (ref 3.9–4.9)
Alkaline Phosphatase: 68 IU/L (ref 47–123)
BUN/Creatinine Ratio: 23 (ref 10–24)
BUN: 18 mg/dL (ref 8–27)
Bilirubin Total: 0.2 mg/dL (ref 0.0–1.2)
CO2: 22 mmol/L (ref 20–29)
Calcium: 9.3 mg/dL (ref 8.6–10.2)
Chloride: 105 mmol/L (ref 96–106)
Creatinine, Ser: 0.8 mg/dL (ref 0.76–1.27)
Globulin, Total: 2.4 g/dL (ref 1.5–4.5)
Glucose: 82 mg/dL (ref 70–99)
Potassium: 4.3 mmol/L (ref 3.5–5.2)
Sodium: 143 mmol/L (ref 134–144)
Total Protein: 6.8 g/dL (ref 6.0–8.5)
eGFR: 99 mL/min/1.73

## 2024-09-15 LAB — LIPID PANEL
Chol/HDL Ratio: 4.8 ratio (ref 0.0–5.0)
Cholesterol, Total: 222 mg/dL — ABNORMAL HIGH (ref 100–199)
HDL: 46 mg/dL
LDL Chol Calc (NIH): 146 mg/dL — ABNORMAL HIGH (ref 0–99)
Triglycerides: 166 mg/dL — ABNORMAL HIGH (ref 0–149)
VLDL Cholesterol Cal: 30 mg/dL (ref 5–40)

## 2024-09-15 LAB — HEMOGLOBIN A1C
Est. average glucose Bld gHb Est-mCnc: 123 mg/dL
Hgb A1c MFr Bld: 5.9 % — ABNORMAL HIGH (ref 4.8–5.6)

## 2024-09-15 LAB — TSH: TSH: 3.72 u[IU]/mL (ref 0.450–4.500)

## 2024-09-15 LAB — PSA: Prostate Specific Ag, Serum: 0.7 ng/mL (ref 0.0–4.0)

## 2024-09-17 ENCOUNTER — Ambulatory Visit: Payer: Self-pay | Admitting: Cardiology

## 2024-10-02 ENCOUNTER — Telehealth: Payer: Self-pay

## 2024-10-02 NOTE — Telephone Encounter (Signed)
 Pt LM askig for call back about a BP med that was supposed to be sent in, and hasn't yet. The patient was supposed to call us  back and let us  know what the medication was bc he was unsure but I'm not aware if he's called back besides this call or not

## 2024-10-03 ENCOUNTER — Telehealth: Payer: Self-pay | Admitting: Cardiology

## 2024-10-03 ENCOUNTER — Other Ambulatory Visit: Payer: Self-pay | Admitting: Cardiology

## 2024-10-03 MED ORDER — VALSARTAN 40 MG PO TABS: 40.0000 mg | ORAL_TABLET | Freq: Every day | ORAL | 0 refills | Status: AC

## 2024-10-03 NOTE — Telephone Encounter (Signed)
 Dr. Fernand sent the scripts.

## 2024-10-05 ENCOUNTER — Telehealth: Payer: Self-pay | Admitting: Internal Medicine

## 2024-10-05 NOTE — Telephone Encounter (Signed)
 Pt needs his BP meds refilled and sent to [facility] [address]. States he has talked with someone 3 times about this and it is still not fixed or refilled

## 2024-10-12 ENCOUNTER — Ambulatory Visit: Payer: Medicare (Managed Care) | Admitting: Internal Medicine

## 2024-10-30 ENCOUNTER — Ambulatory Visit: Payer: Medicare (Managed Care) | Admitting: Internal Medicine
